# Patient Record
Sex: Female | Born: 1935 | Race: White | Hispanic: No
Health system: Southern US, Community
[De-identification: ages and names within clinical notes are randomized; demographics above are authoritative.]

---

## 2005-11-02 ENCOUNTER — Ambulatory Visit: Payer: Self-pay | Admitting: Internal Medicine

## 2008-04-21 ENCOUNTER — Ambulatory Visit: Payer: Self-pay | Admitting: Family Medicine

## 2010-03-14 ENCOUNTER — Ambulatory Visit: Payer: Self-pay | Admitting: Internal Medicine

## 2010-06-28 ENCOUNTER — Ambulatory Visit: Payer: Self-pay | Admitting: Internal Medicine

## 2010-07-27 ENCOUNTER — Ambulatory Visit: Payer: Self-pay | Admitting: Internal Medicine

## 2010-10-16 ENCOUNTER — Ambulatory Visit: Payer: Self-pay | Admitting: Unknown Physician Specialty

## 2010-10-18 LAB — PATHOLOGY REPORT

## 2011-02-14 ENCOUNTER — Ambulatory Visit: Payer: Self-pay | Admitting: Internal Medicine

## 2011-06-23 ENCOUNTER — Emergency Department: Payer: Self-pay | Admitting: *Deleted

## 2011-07-19 ENCOUNTER — Ambulatory Visit: Payer: Self-pay | Admitting: Urology

## 2012-09-20 ENCOUNTER — Inpatient Hospital Stay: Payer: Self-pay | Admitting: Internal Medicine

## 2012-09-20 LAB — COMPREHENSIVE METABOLIC PANEL
Alkaline Phosphatase: 96 U/L (ref 50–136)
Anion Gap: 10 (ref 7–16)
Bilirubin,Total: 1 mg/dL (ref 0.2–1.0)
Co2: 23 mmol/L (ref 21–32)
Creatinine: 0.94 mg/dL (ref 0.60–1.30)
EGFR (African American): >60
EGFR (Non-African Amer.): 56 — ABNORMAL LOW
SGPT (ALT): 16 U/L (ref 12–78)

## 2012-09-20 LAB — CBC
HCT: 47.2 % — ABNORMAL HIGH (ref 35.0–47.0)
HGB: 15.9 g/dL (ref 12.0–16.0)
MCH: 30.2 pg (ref 26.0–34.0)
MCV: 90 fL (ref 80–100)
Platelet: 280 10*3/uL (ref 150–440)
RBC: 5.27 10*6/uL — ABNORMAL HIGH (ref 3.80–5.20)
WBC: 16.9 10*3/uL — ABNORMAL HIGH (ref 3.6–11.0)

## 2012-09-20 LAB — URINALYSIS, COMPLETE
Glucose,UR: NEGATIVE mg/dL (ref 0–75)
Hyaline Cast: 1
Nitrite: NEGATIVE
Protein: NEGATIVE
RBC,UR: 1 /HPF (ref 0–5)
Squamous Epithelial: 2
WBC UR: <1 /HPF (ref 0–5)

## 2012-09-20 LAB — TSH: Thyroid Stimulating Horm: 2.74 u[IU]/mL

## 2012-09-20 LAB — PROTIME-INR
INR: 1.3
Prothrombin Time: 16.9 secs — ABNORMAL HIGH (ref 11.5–14.7)

## 2012-09-20 LAB — DIGOXIN LEVEL: Digoxin: 0.65 ng/mL

## 2012-09-21 LAB — COMPREHENSIVE METABOLIC PANEL
Albumin: 2.9 g/dL — ABNORMAL LOW (ref 3.4–5.0)
Anion Gap: 9 (ref 7–16)
BUN: 14 mg/dL (ref 7–18)
Bilirubin,Total: 0.7 mg/dL (ref 0.2–1.0)
Calcium, Total: 7.9 mg/dL — ABNORMAL LOW (ref 8.5–10.1)
Creatinine: 0.85 mg/dL (ref 0.60–1.30)
Glucose: 137 mg/dL — ABNORMAL HIGH (ref 65–99)
Potassium: 3.5 mmol/L (ref 3.5–5.1)
SGOT(AST): 15 U/L (ref 15–37)
SGPT (ALT): 12 U/L (ref 12–78)
Total Protein: 5.5 g/dL — ABNORMAL LOW (ref 6.4–8.2)

## 2012-09-21 LAB — CLOSTRIDIUM DIFFICILE BY PCR

## 2012-09-21 LAB — CBC WITH DIFFERENTIAL/PLATELET
Eosinophil #: 0.1 10*3/uL (ref 0.0–0.7)
Eosinophil %: 0.3 %
HCT: 43.1 % (ref 35.0–47.0)
Lymphocyte #: 2 10*3/uL (ref 1.0–3.6)
Monocyte %: 7.9 %
Neutrophil #: 12.1 10*3/uL — ABNORMAL HIGH (ref 1.4–6.5)
Neutrophil %: 78.4 %
Platelet: 238 10*3/uL (ref 150–440)
RDW: 15 % — ABNORMAL HIGH (ref 11.5–14.5)
WBC: 15.4 10*3/uL — ABNORMAL HIGH (ref 3.6–11.0)

## 2012-09-21 LAB — PROTIME-INR
INR: 1.4
Prothrombin Time: 17.2 secs — ABNORMAL HIGH (ref 11.5–14.7)

## 2012-09-22 LAB — CBC WITH DIFFERENTIAL/PLATELET
Eosinophil #: 0.2 10*3/uL (ref 0.0–0.7)
HCT: 38.3 % (ref 35.0–47.0)
Lymphocyte %: 23.7 %
MCH: 30 pg (ref 26.0–34.0)
MCHC: 33.4 g/dL (ref 32.0–36.0)
Neutrophil %: 61.8 %
Platelet: 213 10*3/uL (ref 150–440)
RDW: 15 % — ABNORMAL HIGH (ref 11.5–14.5)

## 2012-09-22 LAB — PROTIME-INR: INR: 1.7

## 2012-09-22 LAB — URINE CULTURE

## 2012-09-23 LAB — BASIC METABOLIC PANEL
Anion Gap: 7 (ref 7–16)
BUN: 10 mg/dL (ref 7–18)
Calcium, Total: 8 mg/dL — ABNORMAL LOW (ref 8.5–10.1)
Chloride: 111 mmol/L — ABNORMAL HIGH (ref 98–107)
Co2: 25 mmol/L (ref 21–32)
EGFR (Non-African Amer.): >60
Glucose: 94 mg/dL (ref 65–99)
Osmolality: 284 (ref 275–301)
Potassium: 3.6 mmol/L (ref 3.5–5.1)

## 2012-09-23 LAB — PROTIME-INR
INR: 1.7
Prothrombin Time: 20.7 secs — ABNORMAL HIGH (ref 11.5–14.7)

## 2012-09-23 LAB — STOOL CULTURE

## 2012-09-26 LAB — CULTURE, BLOOD (SINGLE)

## 2013-02-03 ENCOUNTER — Emergency Department: Payer: Self-pay | Admitting: Emergency Medicine

## 2013-02-03 LAB — COMPREHENSIVE METABOLIC PANEL
Albumin: 3.3 g/dL — ABNORMAL LOW (ref 3.4–5.0)
Alkaline Phosphatase: 116 U/L (ref 50–136)
Anion Gap: 8 (ref 7–16)
BUN: 10 mg/dL (ref 7–18)
Bilirubin,Total: 0.9 mg/dL (ref 0.2–1.0)
Calcium, Total: 8.3 mg/dL — ABNORMAL LOW (ref 8.5–10.1)
Chloride: 109 mmol/L — ABNORMAL HIGH (ref 98–107)
Co2: 24 mmol/L (ref 21–32)
Creatinine: 0.71 mg/dL (ref 0.60–1.30)
EGFR (African American): >60
EGFR (Non-African Amer.): >60
Osmolality: 280 (ref 275–301)
Potassium: 4 mmol/L (ref 3.5–5.1)
SGOT(AST): 17 U/L (ref 15–37)
SGPT (ALT): 15 U/L (ref 12–78)
Sodium: 141 mmol/L (ref 136–145)
Total Protein: 6.2 g/dL — ABNORMAL LOW (ref 6.4–8.2)

## 2013-02-03 LAB — CBC
HCT: 40.9 % (ref 35.0–47.0)
MCH: 29.3 pg (ref 26.0–34.0)
MCHC: 33.6 g/dL (ref 32.0–36.0)
Platelet: 310 10*3/uL (ref 150–440)
RBC: 4.69 10*6/uL (ref 3.80–5.20)
RDW: 15.9 % — ABNORMAL HIGH (ref 11.5–14.5)
WBC: 10.3 10*3/uL (ref 3.6–11.0)

## 2013-02-03 LAB — PROTIME-INR
INR: 3
Prothrombin Time: 30.1 secs — ABNORMAL HIGH (ref 11.5–14.7)

## 2013-02-03 LAB — URINALYSIS, COMPLETE
Bilirubin,UR: NEGATIVE
Blood: NEGATIVE
Glucose,UR: NEGATIVE mg/dL (ref 0–75)
Ketone: NEGATIVE
Nitrite: POSITIVE
Protein: NEGATIVE
RBC,UR: 7 /HPF (ref 0–5)
Specific Gravity: 1.013 (ref 1.003–1.030)
Squamous Epithelial: 5
WBC UR: 12 /HPF (ref 0–5)

## 2013-02-03 LAB — PRO B NATRIURETIC PEPTIDE: B-Type Natriuretic Peptide: 2256 pg/mL — ABNORMAL HIGH (ref 0–450)

## 2014-10-01 ENCOUNTER — Inpatient Hospital Stay: Payer: Self-pay | Admitting: Internal Medicine

## 2014-10-01 LAB — CK-MB: CK-MB: 1.2 ng/mL (ref 0.5–3.6)

## 2014-10-01 LAB — URINALYSIS, COMPLETE
BLOOD: NEGATIVE
Bilirubin,UR: NEGATIVE
GLUCOSE, UR: NEGATIVE mg/dL (ref 0–75)
Ketone: NEGATIVE
Nitrite: POSITIVE
Ph: 6 (ref 4.5–8.0)
Protein: 30
Specific Gravity: 1.013 (ref 1.003–1.030)
WBC UR: 10 /HPF (ref 0–5)

## 2014-10-01 LAB — CBC
HCT: 42.6 % (ref 35.0–47.0)
HGB: 13.7 g/dL (ref 12.0–16.0)
MCH: 27.6 pg (ref 26.0–34.0)
MCHC: 32.2 g/dL (ref 32.0–36.0)
MCV: 86 fL (ref 80–100)
PLATELETS: 321 10*3/uL (ref 150–440)
RBC: 4.97 10*6/uL (ref 3.80–5.20)
RDW: 17.2 % — ABNORMAL HIGH (ref 11.5–14.5)
WBC: 13.3 10*3/uL — ABNORMAL HIGH (ref 3.6–11.0)

## 2014-10-01 LAB — COMPREHENSIVE METABOLIC PANEL
Albumin: 3.4 g/dL (ref 3.4–5.0)
Alkaline Phosphatase: 123 U/L — ABNORMAL HIGH
Anion Gap: 5 — ABNORMAL LOW (ref 7–16)
BILIRUBIN TOTAL: 1 mg/dL (ref 0.2–1.0)
BUN: 11 mg/dL (ref 7–18)
CREATININE: 0.78 mg/dL (ref 0.60–1.30)
Calcium, Total: 8.7 mg/dL (ref 8.5–10.1)
Chloride: 105 mmol/L (ref 98–107)
Co2: 28 mmol/L (ref 21–32)
EGFR (Non-African Amer.): >60
Glucose: 119 mg/dL — ABNORMAL HIGH (ref 65–99)
OSMOLALITY: 276 (ref 275–301)
Potassium: 4.2 mmol/L (ref 3.5–5.1)
SGOT(AST): 12 U/L — ABNORMAL LOW (ref 15–37)
SGPT (ALT): 13 U/L — ABNORMAL LOW
Sodium: 138 mmol/L (ref 136–145)
TOTAL PROTEIN: 6.7 g/dL (ref 6.4–8.2)

## 2014-10-01 LAB — PROTIME-INR
INR: 2.4
Prothrombin Time: 25.3 secs — ABNORMAL HIGH (ref 11.5–14.7)

## 2014-10-01 LAB — TROPONIN I: Troponin-I: <0.02 ng/mL

## 2014-10-01 LAB — CK: CK, Total: 24 U/L — ABNORMAL LOW (ref 26–192)

## 2014-10-01 LAB — PRO B NATRIURETIC PEPTIDE: B-TYPE NATIURETIC PEPTID: 1678 pg/mL — AB (ref 0–450)

## 2014-10-02 LAB — BASIC METABOLIC PANEL
Anion Gap: 5 — ABNORMAL LOW (ref 7–16)
BUN: 12 mg/dL (ref 7–18)
CALCIUM: 8.1 mg/dL — AB (ref 8.5–10.1)
CO2: 31 mmol/L (ref 21–32)
CREATININE: 0.71 mg/dL (ref 0.60–1.30)
Chloride: 103 mmol/L (ref 98–107)
EGFR (Non-African Amer.): >60
GLUCOSE: 108 mg/dL — AB (ref 65–99)
Osmolality: 278 (ref 275–301)
POTASSIUM: 3.8 mmol/L (ref 3.5–5.1)
SODIUM: 139 mmol/L (ref 136–145)

## 2014-10-02 LAB — CBC WITH DIFFERENTIAL/PLATELET
Basophil #: 0.1 10*3/uL (ref 0.0–0.1)
Basophil %: 0.4 %
EOS ABS: 0 10*3/uL (ref 0.0–0.7)
Eosinophil %: 0.2 %
HCT: 42 % (ref 35.0–47.0)
HGB: 13.6 g/dL (ref 12.0–16.0)
LYMPHS ABS: 1.3 10*3/uL (ref 1.0–3.6)
LYMPHS PCT: 8.6 %
MCH: 27.6 pg (ref 26.0–34.0)
MCHC: 32.4 g/dL (ref 32.0–36.0)
MCV: 85 fL (ref 80–100)
Monocyte #: 1.6 x10 3/mm — ABNORMAL HIGH (ref 0.2–0.9)
Monocyte %: 10.1 %
NEUTROS PCT: 80.7 %
Neutrophil #: 12.6 10*3/uL — ABNORMAL HIGH (ref 1.4–6.5)
Platelet: 338 10*3/uL (ref 150–440)
RBC: 4.93 10*6/uL (ref 3.80–5.20)
RDW: 16.7 % — AB (ref 11.5–14.5)
WBC: 15.6 10*3/uL — ABNORMAL HIGH (ref 3.6–11.0)

## 2014-10-02 LAB — TROPONIN I: Troponin-I: <0.02 ng/mL

## 2014-10-02 LAB — LIPID PANEL
CHOLESTEROL: 145 mg/dL (ref 0–200)
HDL: 91 mg/dL — AB (ref 40–60)
LDL CHOLESTEROL, CALC: 41 mg/dL (ref 0–100)
Triglycerides: 67 mg/dL (ref 0–200)
VLDL Cholesterol, Calc: 13 mg/dL (ref 5–40)

## 2014-10-02 LAB — CK
CK, TOTAL: 22 U/L — AB (ref 26–192)
CK, Total: 15 U/L — ABNORMAL LOW (ref 26–192)

## 2014-10-02 LAB — CK-MB
CK-MB: 1.4 ng/mL (ref 0.5–3.6)
CK-MB: 1.2 ng/mL (ref 0.5–3.6)

## 2014-10-02 LAB — PROTIME-INR
INR: 2.6
Prothrombin Time: 27.2 secs — ABNORMAL HIGH (ref 11.5–14.7)

## 2014-10-02 LAB — TSH: THYROID STIMULATING HORM: 1.29 u[IU]/mL

## 2014-10-03 LAB — BASIC METABOLIC PANEL
Anion Gap: 5 — ABNORMAL LOW (ref 7–16)
BUN: 12 mg/dL (ref 7–18)
CHLORIDE: 100 mmol/L (ref 98–107)
CREATININE: 0.76 mg/dL (ref 0.60–1.30)
Calcium, Total: 8.3 mg/dL — ABNORMAL LOW (ref 8.5–10.1)
Co2: 33 mmol/L — ABNORMAL HIGH (ref 21–32)
EGFR (African American): >60
Glucose: 115 mg/dL — ABNORMAL HIGH (ref 65–99)
OSMOLALITY: 276 (ref 275–301)
Potassium: 4 mmol/L (ref 3.5–5.1)
SODIUM: 138 mmol/L (ref 136–145)

## 2014-10-03 LAB — CBC WITH DIFFERENTIAL/PLATELET
Basophil #: 0.1 10*3/uL (ref 0.0–0.1)
Basophil %: 0.6 %
Eosinophil #: 0.1 10*3/uL (ref 0.0–0.7)
Eosinophil %: 0.4 %
HCT: 40.9 % (ref 35.0–47.0)
HGB: 13.2 g/dL (ref 12.0–16.0)
Lymphocyte #: 1.3 10*3/uL (ref 1.0–3.6)
Lymphocyte %: 9.5 %
MCH: 27.8 pg (ref 26.0–34.0)
MCHC: 32.3 g/dL (ref 32.0–36.0)
MCV: 86 fL (ref 80–100)
MONOS PCT: 10.9 %
Monocyte #: 1.5 x10 3/mm — ABNORMAL HIGH (ref 0.2–0.9)
NEUTROS ABS: 10.5 10*3/uL — AB (ref 1.4–6.5)
Neutrophil %: 78.6 %
Platelet: 299 10*3/uL (ref 150–440)
RBC: 4.76 10*6/uL (ref 3.80–5.20)
RDW: 16.7 % — ABNORMAL HIGH (ref 11.5–14.5)
WBC: 13.4 10*3/uL — ABNORMAL HIGH (ref 3.6–11.0)

## 2014-10-03 LAB — PROTIME-INR
INR: 2.5
PROTHROMBIN TIME: 26.6 s — AB (ref 11.5–14.7)

## 2014-10-03 LAB — URINE CULTURE

## 2014-10-04 LAB — BASIC METABOLIC PANEL
Anion Gap: 7 (ref 7–16)
BUN: 14 mg/dL (ref 7–18)
CALCIUM: 8 mg/dL — AB (ref 8.5–10.1)
CHLORIDE: 103 mmol/L (ref 98–107)
Co2: 31 mmol/L (ref 21–32)
Creatinine: 0.76 mg/dL (ref 0.60–1.30)
EGFR (Non-African Amer.): >60
Glucose: 88 mg/dL (ref 65–99)
OSMOLALITY: 281 (ref 275–301)
POTASSIUM: 3.6 mmol/L (ref 3.5–5.1)
SODIUM: 141 mmol/L (ref 136–145)

## 2014-10-04 LAB — CBC WITH DIFFERENTIAL/PLATELET
BASOS ABS: 0.1 10*3/uL (ref 0.0–0.1)
BASOS PCT: 1 %
EOS ABS: 0.3 10*3/uL (ref 0.0–0.7)
EOS PCT: 2.5 %
HCT: 40.5 % (ref 35.0–47.0)
HGB: 13.1 g/dL (ref 12.0–16.0)
Lymphocyte #: 1.7 10*3/uL (ref 1.0–3.6)
Lymphocyte %: 17.1 %
MCH: 27.6 pg (ref 26.0–34.0)
MCHC: 32.2 g/dL (ref 32.0–36.0)
MCV: 86 fL (ref 80–100)
Monocyte #: 1.5 x10 3/mm — ABNORMAL HIGH (ref 0.2–0.9)
Monocyte %: 15 %
Neutrophil #: 6.6 10*3/uL — ABNORMAL HIGH (ref 1.4–6.5)
Neutrophil %: 64.4 %
PLATELETS: 278 10*3/uL (ref 150–440)
RBC: 4.72 10*6/uL (ref 3.80–5.20)
RDW: 16.6 % — ABNORMAL HIGH (ref 11.5–14.5)
WBC: 10.2 10*3/uL (ref 3.6–11.0)

## 2014-10-04 LAB — PROTIME-INR
INR: 2.8
Prothrombin Time: 28.6 secs — ABNORMAL HIGH (ref 11.5–14.7)

## 2014-10-05 LAB — BASIC METABOLIC PANEL
ANION GAP: 6 — AB (ref 7–16)
BUN: 16 mg/dL (ref 7–18)
CO2: 33 mmol/L — AB (ref 21–32)
Calcium, Total: 8 mg/dL — ABNORMAL LOW (ref 8.5–10.1)
Chloride: 100 mmol/L (ref 98–107)
Creatinine: 0.82 mg/dL (ref 0.60–1.30)
EGFR (Non-African Amer.): >60
Glucose: 99 mg/dL (ref 65–99)
OSMOLALITY: 279 (ref 275–301)
POTASSIUM: 3.6 mmol/L (ref 3.5–5.1)
SODIUM: 139 mmol/L (ref 136–145)

## 2014-10-05 LAB — PROTIME-INR
INR: 2.8
PROTHROMBIN TIME: 28.5 s — AB (ref 11.5–14.7)

## 2014-12-21 ENCOUNTER — Emergency Department: Payer: Self-pay | Admitting: Student

## 2015-02-04 ENCOUNTER — Ambulatory Visit
Admit: 2015-02-04 | Disposition: A | Discharge status: Home / Self Care | Payer: Self-pay | Attending: Internal Medicine | Admitting: Internal Medicine

## 2015-02-09 ENCOUNTER — Inpatient Hospital Stay
Admit: 2015-02-09 | Disposition: A | Discharge status: Home / Self Care | Payer: Self-pay | Attending: Internal Medicine | Admitting: Internal Medicine

## 2015-02-09 LAB — CK TOTAL AND CKMB (NOT AT ARMC)
CK, Total: 9 U/L — ABNORMAL LOW
CK-MB: 4.2 ng/mL

## 2015-02-09 LAB — CBC
HCT: 36.8 % (ref 35.0–47.0)
HGB: 11.9 g/dL — ABNORMAL LOW (ref 12.0–16.0)
MCH: 26.9 pg (ref 26.0–34.0)
MCHC: 32.4 g/dL (ref 32.0–36.0)
MCV: 83 fL (ref 80–100)
PLATELETS: 456 10*3/uL — AB (ref 150–440)
RBC: 4.42 10*6/uL (ref 3.80–5.20)
RDW: 16.7 % — ABNORMAL HIGH (ref 11.5–14.5)
WBC: 25.9 10*3/uL — AB (ref 3.6–11.0)

## 2015-02-09 LAB — URINALYSIS, COMPLETE
Blood: NEGATIVE
GLUCOSE, UR: NEGATIVE mg/dL (ref 0–75)
Nitrite: POSITIVE
PH: 5 (ref 4.5–8.0)
Protein: 100
RBC,UR: 6 /HPF (ref 0–5)
SPECIFIC GRAVITY: 1.031 (ref 1.003–1.030)
Squamous Epithelial: 3
WBC UR: 16 /HPF (ref 0–5)

## 2015-02-09 LAB — COMPREHENSIVE METABOLIC PANEL
ALT: 9 U/L — AB
AST: 15 U/L
Albumin: 2.8 g/dL — ABNORMAL LOW
Alkaline Phosphatase: 112 U/L
Anion Gap: 10 (ref 7–16)
BILIRUBIN TOTAL: 1.5 mg/dL — AB
BUN: 18 mg/dL
CHLORIDE: 100 mmol/L — AB
CO2: 27 mmol/L
CREATININE: 0.66 mg/dL
Calcium, Total: 8.6 mg/dL — ABNORMAL LOW
EGFR (African American): >60
GLUCOSE: 120 mg/dL — AB
Potassium: 3.8 mmol/L
Sodium: 137 mmol/L
Total Protein: 6 g/dL — ABNORMAL LOW

## 2015-02-09 LAB — TROPONIN I
TROPONIN-I: 0.03 ng/mL
Troponin-I: 0.05 ng/mL — ABNORMAL HIGH
Troponin-I: <0.03 ng/mL

## 2015-02-09 LAB — PROTIME-INR
INR: 2.7
Prothrombin Time: 28.7 secs — ABNORMAL HIGH

## 2015-02-09 LAB — CK-MB
CK-MB: 4.5 ng/mL
CK-MB: 2.6 ng/mL

## 2015-02-09 LAB — PRO B NATRIURETIC PEPTIDE: B-TYPE NATIURETIC PEPTID: 375 pg/mL — AB

## 2015-02-09 LAB — TSH: THYROID STIMULATING HORM: 0.674 u[IU]/mL

## 2015-02-10 LAB — BASIC METABOLIC PANEL
Anion Gap: 11 (ref 7–16)
BUN: 16 mg/dL
CALCIUM: 8.6 mg/dL — AB
CHLORIDE: 101 mmol/L
Co2: 29 mmol/L
Creatinine: 0.52 mg/dL
EGFR (African American): >60
Glucose: 149 mg/dL — ABNORMAL HIGH
Potassium: 3.7 mmol/L
Sodium: 141 mmol/L

## 2015-02-10 LAB — CBC WITH DIFFERENTIAL/PLATELET
BANDS NEUTROPHIL: 10 %
HCT: 36.9 % (ref 35.0–47.0)
HGB: 11.7 g/dL — ABNORMAL LOW (ref 12.0–16.0)
Lymphocytes: 10 %
MCH: 26.3 pg (ref 26.0–34.0)
MCHC: 31.8 g/dL — AB (ref 32.0–36.0)
MCV: 83 fL (ref 80–100)
METAMYELOCYTE: 1 %
MONOS PCT: 3 %
MYELOCYTE: 1 %
Platelet: 400 10*3/uL (ref 150–440)
RBC: 4.47 10*6/uL (ref 3.80–5.20)
RDW: 16.4 % — ABNORMAL HIGH (ref 11.5–14.5)
SEGMENTED NEUTROPHILS: 75 %
WBC: 17.3 10*3/uL — ABNORMAL HIGH (ref 3.6–11.0)

## 2015-02-10 LAB — PROTIME-INR
INR: 2.6
Prothrombin Time: 27.5 secs — ABNORMAL HIGH

## 2015-02-11 LAB — BASIC METABOLIC PANEL
Anion Gap: 8 (ref 7–16)
BUN: 27 mg/dL — AB
CO2: 29 mmol/L
Calcium, Total: 8.7 mg/dL — ABNORMAL LOW
Chloride: 101 mmol/L
Creatinine: 0.68 mg/dL
EGFR (African American): >60
Glucose: 147 mg/dL — ABNORMAL HIGH
Potassium: 4.4 mmol/L
Sodium: 138 mmol/L

## 2015-02-11 LAB — CBC WITH DIFFERENTIAL/PLATELET
Bands: 6 %
COMMENT - H1-COM1: NORMAL
HCT: 35.1 % (ref 35.0–47.0)
HGB: 11.7 g/dL — ABNORMAL LOW (ref 12.0–16.0)
Lymphocytes: 5 %
MCH: 27.3 pg (ref 26.0–34.0)
MCHC: 33.4 g/dL (ref 32.0–36.0)
MCV: 82 fL (ref 80–100)
METAMYELOCYTE: 2 %
Monocytes: 10 %
Platelet: 406 10*3/uL (ref 150–440)
RBC: 4.3 10*6/uL (ref 3.80–5.20)
RDW: 16.4 % — AB (ref 11.5–14.5)
Segmented Neutrophils: 77 %
WBC: 30.6 10*3/uL — ABNORMAL HIGH (ref 3.6–11.0)

## 2015-02-11 LAB — PROTIME-INR
INR: 3.1
PROTHROMBIN TIME: 31.7 s — AB

## 2015-02-12 LAB — CBC WITH DIFFERENTIAL/PLATELET
BASOS PCT: 0.1 %
Basophil #: 0 10*3/uL (ref 0.0–0.1)
EOS PCT: 0 %
Eosinophil #: 0 10*3/uL (ref 0.0–0.7)
HCT: 35 % (ref 35.0–47.0)
HGB: 11 g/dL — AB (ref 12.0–16.0)
LYMPHS PCT: 3.8 %
Lymphocyte #: 0.9 10*3/uL — ABNORMAL LOW (ref 1.0–3.6)
MCH: 26.1 pg (ref 26.0–34.0)
MCHC: 31.4 g/dL — ABNORMAL LOW (ref 32.0–36.0)
MCV: 83 fL (ref 80–100)
MONOS PCT: 6.1 %
Monocyte #: 1.4 x10 3/mm — ABNORMAL HIGH (ref 0.2–0.9)
NEUTROS ABS: 20.8 10*3/uL — AB (ref 1.4–6.5)
Neutrophil %: 90 %
Platelet: 411 10*3/uL (ref 150–440)
RBC: 4.22 10*6/uL (ref 3.80–5.20)
RDW: 16.1 % — ABNORMAL HIGH (ref 11.5–14.5)
WBC: 23.1 10*3/uL — AB (ref 3.6–11.0)

## 2015-02-12 LAB — PROTIME-INR
INR: 3.8
PROTHROMBIN TIME: 37.7 s — AB

## 2015-02-12 LAB — BASIC METABOLIC PANEL
Anion Gap: 8 (ref 7–16)
BUN: 27 mg/dL — ABNORMAL HIGH
CHLORIDE: 99 mmol/L — AB
CO2: 31 mmol/L
CREATININE: 0.7 mg/dL
Calcium, Total: 8.3 mg/dL — ABNORMAL LOW
EGFR (African American): >60
EGFR (Non-African Amer.): >60
Glucose: 147 mg/dL — ABNORMAL HIGH
POTASSIUM: 3.5 mmol/L
Sodium: 138 mmol/L

## 2015-02-13 LAB — CBC WITH DIFFERENTIAL/PLATELET
BANDS NEUTROPHIL: 4 %
HCT: 33.8 % — ABNORMAL LOW (ref 35.0–47.0)
HGB: 10.9 g/dL — ABNORMAL LOW (ref 12.0–16.0)
Lymphocytes: 6 %
MCH: 26.7 pg (ref 26.0–34.0)
MCHC: 32.3 g/dL (ref 32.0–36.0)
MCV: 83 fL (ref 80–100)
Metamyelocyte: 3 %
Monocytes: 7 %
Myelocyte: 1 %
Platelet: 389 10*3/uL (ref 150–440)
RBC: 4.09 10*6/uL (ref 3.80–5.20)
RDW: 16.7 % — AB (ref 11.5–14.5)
SEGMENTED NEUTROPHILS: 79 %
WBC: 21.5 10*3/uL — AB (ref 3.6–11.0)

## 2015-02-13 LAB — BASIC METABOLIC PANEL
Anion Gap: 5 — ABNORMAL LOW (ref 7–16)
BUN: 29 mg/dL — ABNORMAL HIGH
CALCIUM: 8 mg/dL — AB
CHLORIDE: 100 mmol/L — AB
Co2: 34 mmol/L — ABNORMAL HIGH
Creatinine: 0.69 mg/dL
Glucose: 127 mg/dL — ABNORMAL HIGH
POTASSIUM: 3.2 mmol/L — AB
Sodium: 139 mmol/L

## 2015-02-13 LAB — PROTIME-INR
INR: 3.8
Prothrombin Time: 37.7 secs — ABNORMAL HIGH

## 2015-02-14 LAB — BASIC METABOLIC PANEL
Anion Gap: 7 (ref 7–16)
BUN: 27 mg/dL — ABNORMAL HIGH
Calcium, Total: 8.2 mg/dL — ABNORMAL LOW
Chloride: 99 mmol/L — ABNORMAL LOW
Co2: 33 mmol/L — ABNORMAL HIGH
Creatinine: 0.69 mg/dL
Glucose: 109 mg/dL — ABNORMAL HIGH
Potassium: 3.7 mmol/L
Sodium: 139 mmol/L

## 2015-02-14 LAB — CULTURE, BLOOD (SINGLE)

## 2015-02-14 LAB — URINE CULTURE

## 2015-02-14 LAB — PROTIME-INR
INR: 3
Prothrombin Time: 31.2 secs — ABNORMAL HIGH

## 2015-02-14 LAB — CBC WITH DIFFERENTIAL/PLATELET
BASOS PCT: 0.2 %
Basophil #: 0 10*3/uL (ref 0.0–0.1)
EOS ABS: 0 10*3/uL (ref 0.0–0.7)
EOS PCT: 0.1 %
HCT: 34 % — AB (ref 35.0–47.0)
HGB: 10.7 g/dL — AB (ref 12.0–16.0)
Lymphocyte #: 1.4 10*3/uL (ref 1.0–3.6)
Lymphocyte %: 5.6 %
MCH: 25.9 pg — ABNORMAL LOW (ref 26.0–34.0)
MCHC: 31.4 g/dL — ABNORMAL LOW (ref 32.0–36.0)
MCV: 83 fL (ref 80–100)
Monocyte #: 1.6 x10 3/mm — ABNORMAL HIGH (ref 0.2–0.9)
Monocyte %: 6.4 %
Neutrophil #: 21.4 10*3/uL — ABNORMAL HIGH (ref 1.4–6.5)
Neutrophil %: 87.7 %
Platelet: 379 10*3/uL (ref 150–440)
RBC: 4.12 10*6/uL (ref 3.80–5.20)
RDW: 16.6 % — ABNORMAL HIGH (ref 11.5–14.5)
WBC: 24.4 10*3/uL — ABNORMAL HIGH (ref 3.6–11.0)

## 2015-02-15 LAB — WBC: WBC: 25.2 10*3/uL — ABNORMAL HIGH (ref 3.6–11.0)

## 2015-02-15 LAB — BASIC METABOLIC PANEL
ANION GAP: 6 — AB (ref 7–16)
BUN: 23 mg/dL — ABNORMAL HIGH
CREATININE: 0.63 mg/dL
Calcium, Total: 7.9 mg/dL — ABNORMAL LOW
Chloride: 100 mmol/L — ABNORMAL LOW
Co2: 32 mmol/L
EGFR (African American): >60
EGFR (Non-African Amer.): >60
GLUCOSE: 107 mg/dL — AB
Potassium: 3.8 mmol/L
Sodium: 138 mmol/L

## 2015-02-15 LAB — PROTIME-INR
INR: 2.2
Prothrombin Time: 24.5 secs — ABNORMAL HIGH

## 2015-02-15 LAB — LIPASE, BLOOD: Lipase: 51 U/L

## 2015-02-16 LAB — CBC WITH DIFFERENTIAL/PLATELET
Bands: 1 %
HCT: 33 % — AB (ref 35.0–47.0)
HGB: 10.6 g/dL — AB (ref 12.0–16.0)
Lymphocytes: 10 %
MCH: 26.4 pg (ref 26.0–34.0)
MCHC: 32.2 g/dL (ref 32.0–36.0)
MCV: 82 fL (ref 80–100)
MYELOCYTE: 1 %
Metamyelocyte: 1 %
Monocytes: 6 %
Platelet: 286 10*3/uL (ref 150–440)
RBC: 4.04 10*6/uL (ref 3.80–5.20)
RDW: 16.2 % — ABNORMAL HIGH (ref 11.5–14.5)
Segmented Neutrophils: 81 %
WBC: 19.7 10*3/uL — ABNORMAL HIGH (ref 3.6–11.0)

## 2015-02-16 LAB — PROTIME-INR
INR: 1.7
Prothrombin Time: 20 secs — ABNORMAL HIGH

## 2015-02-16 LAB — AMYLASE: Amylase: 59 U/L

## 2015-02-17 LAB — WBC: WBC: 16.1 10*3/uL — AB (ref 3.6–11.0)

## 2015-02-18 LAB — CBC WITH DIFFERENTIAL/PLATELET
Basophil #: 0 10*3/uL (ref 0.0–0.1)
Basophil %: 0.2 %
EOS ABS: 0.1 10*3/uL (ref 0.0–0.7)
Eosinophil %: 0.3 %
HCT: 32.8 % — AB (ref 35.0–47.0)
HGB: 10.5 g/dL — ABNORMAL LOW (ref 12.0–16.0)
LYMPHS ABS: 1.4 10*3/uL (ref 1.0–3.6)
Lymphocyte %: 7.7 %
MCH: 26.5 pg (ref 26.0–34.0)
MCHC: 32 g/dL (ref 32.0–36.0)
MCV: 83 fL (ref 80–100)
MONO ABS: 1.5 x10 3/mm — AB (ref 0.2–0.9)
MONOS PCT: 8.3 %
NEUTROS ABS: 15.4 10*3/uL — AB (ref 1.4–6.5)
Neutrophil %: 83.5 %
PLATELETS: 276 10*3/uL (ref 150–440)
RBC: 3.96 10*6/uL (ref 3.80–5.20)
RDW: 16.7 % — AB (ref 11.5–14.5)
WBC: 18.4 10*3/uL — ABNORMAL HIGH (ref 3.6–11.0)

## 2015-02-18 LAB — URINALYSIS, COMPLETE
BACTERIA: NONE SEEN
Bilirubin,UR: NEGATIVE
Blood: NEGATIVE
GLUCOSE, UR: NEGATIVE mg/dL (ref 0–75)
KETONE: NEGATIVE
Leukocyte Esterase: NEGATIVE
NITRITE: NEGATIVE
Ph: 7 (ref 4.5–8.0)
Protein: NEGATIVE
RBC, UR: NONE SEEN /HPF (ref 0–5)
SPECIFIC GRAVITY: 1.01 (ref 1.003–1.030)

## 2015-02-19 LAB — CBC WITH DIFFERENTIAL/PLATELET
Bands: 2 %
HCT: 36.4 % (ref 35.0–47.0)
HGB: 11.7 g/dL — AB (ref 12.0–16.0)
LYMPHS PCT: 16 %
MCH: 26.8 pg (ref 26.0–34.0)
MCHC: 32.2 g/dL (ref 32.0–36.0)
MCV: 83 fL (ref 80–100)
METAMYELOCYTE: 1 %
MONOS PCT: 13 %
MYELOCYTE: 1 %
Platelet: 289 10*3/uL (ref 150–440)
RBC: 4.37 10*6/uL (ref 3.80–5.20)
RDW: 17.4 % — ABNORMAL HIGH (ref 11.5–14.5)
SEGMENTED NEUTROPHILS: 67 %
WBC: 13.9 10*3/uL — ABNORMAL HIGH (ref 3.6–11.0)

## 2015-02-20 LAB — CULTURE, BLOOD (SINGLE)

## 2015-02-20 LAB — URINE CULTURE

## 2015-02-21 LAB — BASIC METABOLIC PANEL
Anion Gap: 6 — ABNORMAL LOW (ref 7–16)
BUN: 12 mg/dL
CO2: 27 mmol/L
CREATININE: 0.6 mg/dL
Calcium, Total: 8.1 mg/dL — ABNORMAL LOW
Chloride: 107 mmol/L
EGFR (African American): >60
EGFR (Non-African Amer.): >60
Glucose: 100 mg/dL — ABNORMAL HIGH
Potassium: 4.2 mmol/L
SODIUM: 140 mmol/L

## 2015-02-21 LAB — CBC WITH DIFFERENTIAL/PLATELET
BANDS NEUTROPHIL: 2 %
Eosinophil: 1 %
HCT: 33.1 % — ABNORMAL LOW (ref 35.0–47.0)
HGB: 10.8 g/dL — ABNORMAL LOW (ref 12.0–16.0)
Lymphocytes: 17 %
MCH: 27.1 pg (ref 26.0–34.0)
MCHC: 32.5 g/dL (ref 32.0–36.0)
MCV: 83 fL (ref 80–100)
Metamyelocyte: 1 %
Monocytes: 9 %
Platelet: 307 10*3/uL (ref 150–440)
RBC: 3.98 10*6/uL (ref 3.80–5.20)
RDW: 17.1 % — ABNORMAL HIGH (ref 11.5–14.5)
Segmented Neutrophils: 70 %
WBC: 14.6 10*3/uL — AB (ref 3.6–11.0)

## 2015-02-22 NOTE — H&P (Signed)
PATIENT NAME:  Cindy Simon, Cindy Simon MR#:  [id number] DATE OF BIRTH:  06-22-1926  DATE OF ADMISSION:  09/20/2012  REFERRING PHYSICIAN: Dr. Margarita Grizzle  PRIMARY CARE PHYSICIAN: Dr. Daniel Nones  CHIEF COMPLAINT: Nausea, vomiting, diarrhea.   HISTORY OF PRESENT ILLNESS: The patient is a very pleasant 81 year old Caucasian female with past medical history neurofibromatosis, breast cancer status post bilateral mastectomy, early dementia, hyperthyroidism, depression, atrial fibrillation on anticoagulation, macular degeneration, hypertension who presented to [facility] with one day history of nausea, vomiting, and diarrhea. Patient relates that recently she was treated with Cipro for a urinary tract infection. One day prior to admission she developed nausea, vomiting, and diarrhea. She had numerous episodes of diarrhea while at home. She denied any obvious melena or blood in the stool. She had a CT scan of the abdomen and pelvis performed upon arrival here which showed dilated loops of small bowel in the left abdomen and small bowel wall thickening consistent with enteritis. They were adjacent changes of diverticulitis as well. There was no bowel obstruction. No evidence for mesenteric ischemia or free air. There also was a midline abdominal hernia but there is no sign of strangulation. Patient also has stable bilateral adrenal masses, most likely adenomas. There was a small nodule at the right lung base but this was stable from prior CT of 2011. She was started on Cipro and Flagyl in the Emergency Department. She reported that she had started to feel better in the Emergency Department.   PAST MEDICAL HISTORY:  1. 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  2. Breast cancer status post bilateral mastectomy.  3. Hyperthyroidism treated with propylthiouracil.  4. Early dementia.  5. Depression.  6. Hypertension.  7. Atrial fibrillation on anticoagulation.  8. Macular degeneration.   9. History of hysterectomy.  10. Cholecystectomy.   ALLERGIES: No known drug allergies.   HOME MEDICATIONS:  1. Protonix 40 mg p.o. b.i.d.  2. Warfarin 5.5 mg p.o. daily.  3. Vitamin D3 400 international units p.o. daily.  4. Vitamin B12 500 mcg p.o. daily. 5. Toviaz 8 mg p.o. daily.  6. Propylthiouracil 50 mg p.o. b.i.d.  7. Hydrochlorothiazide 12.5 mg p.o. daily.  8. Fluoxetine 40 mg p.o. daily.  9. Donepezil 5 mg p.o. daily.  10. Diltiazem, dosage unknown, 2 capsules p.o. daily.  11. Digoxin 125 mcg p.o. daily.   SOCIAL HISTORY: Patient lives in [place]. She is widowed. She lives with her daughter. She has three living children. She used to work in [employer]. She denies tobacco, alcohol, or illicit drug use.   FAMILY HISTORY: Mother had history of neurofibromatosis. Father died and had history of diabetes mellitus. She is not aware of neurofibromatosis in any of her siblings.    REVIEW OF SYSTEMS: CONSTITUTIONAL: Patient reports subjective fevers. Denies weight loss. Endorses fatigue. EYES: Denies diplopia. Does have diminished vision out of the left eye. HENT: Reports intermittent headaches. Denies hearing loss. Denies epistaxis, sore throat. CARDIOVASCULAR: Denies chest pain. Has history of atrial fibrillation. RESPIRATORY: Denies cough, shortness of breath, hemoptysis. GASTROINTESTINAL: As per the history of present illness. Has had has nausea, vomiting, and diarrhea, also has some lower abdominal pain. MUSCULOSKELETAL: Denies joint pain, swelling, or redness. INTEGUMENTARY: Has numerous neurofibromas. NEUROLOGIC: Denies focal weakness or numbness but does have generalized weakness at present. PSYCHIATRIC: Has history of depression. ENDOCRINE: Denies polyuria, polydipsia, polyphagia. HEMATOLOGIC/LYMPHATIC: Denies easy bruisability, bleeding, or swollen lymph nodes. ALLERGY/IMMUNOLOGIC: Denies seasonal allergies or history of immunodeficiency.   PHYSICAL EXAMINATION:  VITAL SIGNS:  Temperature 97.4, pulse 75, respirations 20, blood pressure 165/78, pulse oximetry 94% on room air.   GENERAL: Well-developed, well-nourished Caucasian female appears her reported age, currently no acute distress.   HEENT: Normocephalic, atraumatic. Extraocular movements are intact. Pupils are equal, round, reactive to light. No scleral icterus. Conjunctivae are pink. No epistaxis noted. Gross hearing intact. Oral mucosa are dry.   NECK: Supple and without JVD, lymphadenopathy.   LUNGS: Clear to auscultation bilaterally with normal respiratory effort.   CARDIOVASCULAR: S1, S2. Patient noted to be irregular. 2/6 systolic ejection murmur heard.   ABDOMEN: Soft. There is bilateral lower quadrant tenderness noted. No significant rebound tenderness noted. No appreciable gross organomegaly.   EXTREMITIES: No clubbing, cyanosis, or edema.   NEUROLOGIC: Patient is alert and oriented to time, person, and place. Strength is 5/5 in both upper extremities. Strength is slightly diminished in the lower extremities as she had decreased strength with added resistance   GENITOURINARY: No suprapubic tenderness noted at this time.   MUSCULOSKELETAL: No joint redness, swelling or tenderness appreciated.   SKIN: Warm and dry. There are numerous neurofibromas within her skin.   PSYCHIATRIC: Patient with appropriate affect and appears to have good insight into her current illness.   LABORATORY, DIAGNOSTIC AND RADIOLOGICAL DATA: CT scan of the abdomen and pelvis demonstrated dilated loops of small bowel in the left abdomen with small bowel wall thickening consistent with enteritis. There was adjacent changes of diverticulitis. Small bowel wall thickening related to diverticulitis as well as enteritis. No bowel obstruction noted. No evidence of mesenteric ischemia. There was no free air. There were two 1.5 cm left aortic lymph nodes. There was a midline abdominal hernia with herniation of small bowel. Loops of  small bowel did not appear to be strangulated. There were stable bilateral adrenal masses. There is a small nodule at the right lung base which is stable in size from prior CT of 2011. Urinalysis was negative for protein, 1 RBC per high-power field and 1 WBC per high-power field. Chest x-ray showed cardiomegaly. Complete metabolic panel shows sodium 140, potassium 4.0, chloride 107, CO2 23, BUN 17, creatinine 0.94, glucose 138. CBC shows WBC 16.9, hemoglobin 15.9, hematocrit 47, platelets 280, INR 1.3. Digoxin level 0.65.   IMPRESSION/RECOMMENDATIONS: This is an 81 year old Caucasian female with past medical history neurofibromatosis, breast cancer status post bilateral mastectomy, dementia, hyperthyroidism, depression, hypertension, atrial fibrillation on anticoagulation, macular degeneration, urinary incontinence who presented to [facility] with nausea, vomiting, diarrhea and found to have enteritis as well as diverticulitis on CT scan. Patient also with significant leukocytosis.  1. Diverticulitis/enteritis. The patient was on antibiotic therapy at home for urinary tract infection. CT scan demonstrated enteritis as well as diverticulitis. However, C. difficile colitis is also a consideration. We will start the patient on IV fluid hydration. For now we will continue Cipro and Flagyl which the patient received while in the Emergency Department. Blood cultures have already been ordered. We will place the patient on a clear liquid diet. We will also obtain GI consultation.  2. Generalized weakness. Most likely due to the acute illness. Hopefully with treatment her lower extremity strength should improve.  3. Hypertension. We will hold hydrochlorothiazide as the patient has a potential dehydrating illness at this point in time. Will continue to monitor blood pressure closely.  4. Atrial fibrillation. Patient's INR was actually found to be a bit low. We will resume the patient on Coumadin  5.5 mg p.o. daily. Will also  continue the patient on Cardizem and digoxin.  5. Hyperthyroidism. We will continue the patient on propylthiouracil. We will also check a TSH at this point in time.  6. Dementia. Will continue the patient's home dose of Aricept.  7. Midline abdominal hernia with herniation of small bowel. There was no sign of strangulation on this. Not an acute issue at this point in time but would certainly recommend continued outpatient observation of this.  8. Deep vein thrombosis prophylaxis. Will start the patient on heparin 5000 units sub-Q q.12 hours.   9. CODE STATUS: FULL CODE at this time. The patient does have a daughter who lives with her who can help to make medical decisions for her.       TIME SPENT: One hour.    ____________________________ Lennox PippinsMunsoor N. Tylesha Gibeault, MD mnl:cms D: 09/20/2012 15:15:48 ET T: 09/20/2012 15:48:28 ET JOB#: [id number]  cc: Lennox PippinsMunsoor N. Javon Hupfer, MD, <Dictator> Lynnea FerrierBert J. Klein III, MD  Ria CommentMUNSOOR N Jeanette Moffatt MD ELECTRONICALLY SIGNED 10/18/2012 20:38

## 2015-02-22 NOTE — Consult Note (Signed)
PATIENT NAME:  Cindy Simon, Cindy Simon MR#:  [id number] DATE OF BIRTH:  [birth year]  DATE OF CONSULTATION:  09/21/2012  REFERRING PHYSICIAN:   CONSULTING PHYSICIAN:  Ezzard Standing. Bluford Kaufmann, MD  REASON FOR REFERRAL: History of diverticulitis.   DESCRIPTION: The patient is an 81 year old white female who has multiple medical history who presents with basically one-day history of nausea, vomiting, and diarrhea. She did not have any fevers or chills or gross hematochezia or melena. The patient was recently treated for a urinary tract infection with Cipro. On arrival to the emergency room, she had a CT scan that showed some dilated loops of bowel in the left abdomen as well as some small bowel thickening consistent with enteritis, gastroenteritis. There were also some adjacent changes of diverticulitis as well. As a result, the patient was admitted for further treatment and evaluation.   By the time I saw her this morning, she is already starting to feel better and she is hungry for solid food. She tells me she had a normal colonoscopy here one to two years ago but I did not see any records of this anyway in the hospital record. She did have an upper endoscopy some years ago.   PAST MEDICAL HISTORY:  1. History of breast cancer requiring bilateral mastectomy.  2. Hyperthyroidism treated with PTU.  3. Dementia.  4. Depression. 5. Hypertension. 6. Chronic atrial fibrillation. 7. Macular degeneration. 8. Neurofibromatosis.   PAST SURGICAL HISTORY:  1. Cholecystectomy. 2. Hysterectomy. 3. Breast cancer surgery.   ALLERGIES: She has no known drug allergies.   MEDICATIONS AT HOME: Protonix, Coumadin at 5.5 mg daily, vitamins, Toviaz,  PTU, hydrochlorothiazide, Donepezil, diltiazem, digoxin, and fluoxetine.  SOCIAL HISTORY: She denies alcohol or tobacco use.   FAMILY HISTORY: History is notable for her mother having neurofibromatosis as well as father with diabetes.  REVIEW OF SYSTEMS: There is really no fevers or  chills or weight gain or weight loss. There is some fatigue. There is no blurred vision or hearing changes. There is no chest pain or palpitations. There is no coughing or shortness of breath. GI symptoms have been described already including nausea, vomiting, and diarrhea, but no hematochezia. The rest of the review of symptoms is negative.   PHYSICAL EXAMINATION:   GENERAL: The patient is in no acute distress.   VITAL SIGNS: He is afebrile with temperature of 97.7 this morning, pulse 65, blood pressure 163/65, and pulse oximetry 95%.   HEENT: Normocephalic, atraumatic head. Pupils are equally reactive. Throat was clear.   NECK: Supple.   CARDIAC: Irregularly irregular rhythm with a systolic murmur.   PULMONARY: Lungs are clear bilaterally.   ABDOMEN: Normoactive bowel sounds. It was soft. There is some tenderness bilaterally in the lower abdomen. There is no hepatomegaly. She had active bowel sounds.   EXTREMITIES: No clubbing, cyanosis, or edema.   NEUROLOGICAL: Examination is nonfocal.   SKIN: Examination shows neurofibromas of the skin.   LABS/RADIOLOGIC STUDIES: Sodium is 140, potassium 3.5, chloride 107, CO2 24, BUN 14, creatinine 0.85, and calcium 7.9. Liver enzymes are normal. TSH is 2.75. Digoxin level was 0.65. White count was 15.4 and hemoglobin 14.3. INR was 1.4.   Urine culture was positive. Blood culture is negative so far. C. difficile was negative. Stool tests are negative so far.   Urinalysis is negative.   Again, CT scan showed some dilated loops of small bowel with some thickening consistent with gastroenteritis. There appears to be some diverticulitis adjacent to it. He also has an  abdominal hernia but this herniation of small bowel does not appear to be strangulated.   IMPRESSION AND RECOMMENDATIONS: This is a patient with gastroenteritis and possible diverticulitis. The patient is on IV antibiotics. The patient wants to eat solid foods so at least try and start  low residue diet and see how she does. If she does well, then she can be discharged on antibiotics. Even though she said she had a normal colonoscopy, I cannot find any records. If it is true that she did not have a colonoscopy in the recent past, then she will need a colonoscopy scheduled as an outpatient in approximately two months. Thank you for the referral.  ____________________________ Ezzard StandingPaul Y. Bluford Kaufmannh, MD pyo:slb D: 09/22/2012 13:17:00 ET T: 09/22/2012 13:27:49 ET JOB#: [id number]  cc: Ezzard StandingPaul Y. Bluford Kaufmannh, MD, <Dictator> Ezzard StandingPAUL Y Emilygrace Grothe MD ELECTRONICALLY SIGNED 09/22/2012 17:03

## 2015-02-22 NOTE — Consult Note (Signed)
Chief Complaint:   Subjective/Chief Complaint Feeling ok. No abd pain. Tolerated solids   VITAL SIGNS/ANCILLARY NOTES: **Vital Signs.:   18-Nov-13 13:58   Vital Signs Type Q 4hr   Temperature Temperature (F) 98.4   Celsius 36.8   Temperature Source Oral   Pulse Pulse 56   Respirations Respirations 17   Systolic BP Systolic BP 153   Diastolic BP (mmHg) Diastolic BP (mmHg) 70   Mean BP 97   Pulse Ox % Pulse Ox % 94   Pulse Ox Activity Level  At rest   Oxygen Delivery Room Air/ 21 %   Brief Assessment:   Cardiac Regular    Respiratory clear BS    Gastrointestinal Normal   Lab Results: Routine Coag:  18-Nov-13 03:24    Prothrombin  19.9   INR 1.7 (INR reference interval applies to patients on anticoagulant therapy. A single INR therapeutic range for coumarins is not optimal for all indications; however, the suggested range for most indications is 2.0 - 3.0. Exceptions to the INR Reference Range may include: Prosthetic heart valves, acute myocardial infarction, prevention of myocardial infarction, and combinations of aspirin and anticoagulant. The need for a higher or lower target INR must be assessed individually. Reference: The Pharmacology and Management of the Vitamin K  antagonists: the seventh ACCP Conference on Antithrombotic and Thrombolytic Therapy. Chest.2004 Sept:126 (3suppl): [id number]. A HCT value >55% may artifactually increase the PT.  In one study,  the increase was an average of 25%. Reference:  "Effect on Routine and Special Coagulation Testing Values of Citrate Anticoagulant Adjustment in Patients with High HCT Values." American Journal of Clinical Pathology 2006;126:400-405.)  Routine Hem:  18-Nov-13 03:24    WBC (CBC) 9.3   RBC (CBC) 4.26   Hemoglobin (CBC) 12.8   Hematocrit (CBC) 38.3   Platelet Count (CBC) 213   MCV 90   MCH 30.0   MCHC 33.4   RDW  15.0   Neutrophil % 61.8   Lymphocyte % 23.7   Monocyte % 12.0   Eosinophil % 1.6    Basophil % 0.9   Neutrophil # 5.7   Lymphocyte # 2.2   Monocyte #  1.1   Eosinophil # 0.2   Basophil # 0.1 (Result(s) reported on 22 Sep 2012 at 03:47AM.)   Assessment/Plan:  Assessment/Plan:   Assessment Enteritis/diverticulitis. Improving.    Plan Pt [facility] Hopefully discharge soon on Abx. [facility]. If patient still here on Wed, will check back on Wed. THanks.   Electronic Signatures: Lutricia Feilh, Lajeana Strough (MD)  (Signed [phone]-Nov-13 15:53)  Authored: Chief Complaint, VITAL SIGNS/ANCILLARY NOTES, Brief Assessment, Lab Results, Assessment/Plan   Last Updated: 18-Nov-13 15:53 by Lutricia Feilh, Marnell Mcdaniel (MD)

## 2015-02-22 NOTE — Discharge Summary (Signed)
PATIENT NAME:  Cindy Simon, Cindy Simon MR#:  [id number] DATE OF BIRTH:  1926/07/28  DATE OF ADMISSION:  09/20/2012 DATE OF DISCHARGE:  09/23/2012  FINAL DIAGNOSES:  1. Diverticulitis.  2. Neurofibromatosis.  3. Dementia.  4. Atrial fibrillation.  5. Hypertension.  6. Urinary tract infection.  7. Hyperthyroidism.  8. Depression with anxiety.   HISTORY AND PHYSICAL: Please see dictated admission history and physical.    HOSPITAL COURSE: Patient was admitted with nausea, vomiting, diarrhea, abdominal pain. Initial CT scan revealed question of enteritis and inflammation of the small bowel, however, there was also possibility that this was secondary to diverticulitis. Clinically patient appeared to be more consistent with diverticulitis, and treatment was initiated for this, to which she responded well. She was placed on Cipro and metronidazole intravenously, with resolution of her symptoms over the next 24 to 36 hours. She was evaluated by GI to help answer the questions raised by the CT scan and they were comfortable with this plan. The patient has reported a colonoscopy in 2008, however, we have not been able to get results on this; she reported to us that [facility]. Recommendations made for consideration of repeat colonoscopy, although in this 81 year old demented patient with atrial fibrillation, it is not entirely certain that this is the best course of action. This was discussed with family members and will readdress when she is completely well from the above.   She was converted to oral medications, diet was started, taken off IV fluids, and she tolerated this without major issues. Physical therapy ambulated the patient. She initially only when 25 feet. Question was raised of skilled nursing, however, fortunately with a second evaluation by physical therapy the next day she was going over 100 feet which appears close to baseline. Family members were present and they felt like she was  ambulating well enough to go home, and so she was discharged home in stable condition with her physical activity to be up with a walker as tolerated. She will follow a 2 gram sodium diet, with her diet bland for one week. She will follow up in our office within the next one week. Home health nursing and physical therapy was ordered for the patient as well.   DISCHARGE MEDICATIONS:  1. Aricept 5 mg p.o. at bedtime.  2. Pantoprazole 40 mg p.o. b.i.d.  3. Fluoxetine 40 mg p.o. daily.  4. Cardizem CD 180 mg p.o. daily.  5. Toviaz 8 mg p.o. daily.  6. Vitamin B12 500 mcg p.o. daily.  7. Vitamin D3 400 units p.o. daily.  8. Seroquel 25 mg p.o. at bedtime.  9. Cipro 500 mg p.o. b.i.d. x7 days.  10. Flagyl 500 mg p.o. t.i.d. x7 days.  11. Coumadin 6 mg p.o. at bedtime.  12. Losartan 50 mg p.o. daily.  13. Propylthiouracil 50 mg p.o. b.i.d.  14. She is given instructions to hold digoxin and hydrochlorothiazide.   ____________________________ Lynnea FerrierBert J. Leroy Trim III, MD bjk:cms D: 09/24/2012 12:27:28 ET T: 09/24/2012 17:01:27 ET JOB#: [id number]  cc: Lynnea FerrierBert J. Desma Wilkowski III, MD, <Dictator> Daniel NonesBERT Yuliet Needs MD ELECTRONICALLY SIGNED 10/08/2012 12:50

## 2015-02-22 NOTE — Consult Note (Signed)
Pt seen and examined. Full consult to follow. Admitted with nausea/vomiting/diarrhea. CT consistent with gastroenteritis and possible diverticulitis. Told me she had normal colonoscopy here 1-2 yrs ago. However, no record of colonoscopy done on hospital records. Already, feeling better. Wants to eat solids. Agree with Abx coverage. Will try low residue diet. If tolerates solids, then can be discharged soon on Abx. If patient did not have colonoscopy in the past, will need colonoscopy scheduled as outpt in 2 months. Thanks.  Electronic Signatures: Lutricia Feilh, Oran Dillenburg (MD)  (Signed on 17-Nov-13 11:10)  Authored  Last Updated: 17-Nov-13 11:10 by Lutricia Feilh, Willies Laviolette (MD)

## 2015-02-26 NOTE — Consult Note (Signed)
PATIENT NAME:  Cindy Simon, Cindy Simon MR#:  [id number] DATE OF BIRTH:  [birth year]  DATE OF CONSULTATION:  10/01/2014  REFERRING PHYSICIAN:   CONSULTING PHYSICIAN:  Cindy MillardAlexander Yosselyn Tax, MD  PRIMARY CARE PHYSICIAN:  Graciela HusbandsKlein, MD   CHIEF COMPLAINT: Stomach pain and weakness.   REASON FOR CONSULTATION: Consultation requested for evaluation of congestive heart failure.   HISTORY OF PRESENT ILLNESS: The patient is an 81 year old female who [facility] Room with mid abdominal discomfort, generalized weakness, and foul-smelling urine. According to the patient and daughter, the patient has had a long-standing history of urinary incontinence and recently has been experiencing foul smelling urine. She has also had a recent history of increase in generalized weakness and was unable to safely get out of bed today. The patient also complains of long-standing bilateral pedal edema and shortness of breath, particularly with exertion. The patient has chronic atrial fibrillation on warfarin therapy.   PAST MEDICAL HISTORY: 1.  Atrial fibrillation.  2.  Urinary incontinence.  3.  Neurofibromatosis.  4.  Hypertension.   MEDICATIONS: Warfarin 4 mg daily, losartan 50 mg daily, diltiazem 360 mg daily, vitamin D3 of 400 international units daily, vitamin B12 of 500 mcg daily, Seroquel 25 mg daily, propylthiouracil 50 mg 2 tabs b.i.d., pantoprazole 40 mg b.i.d., oxybutynin 5 mg daily, fluoxetine 40 mg daily, donepezil 5 mg daily.   SOCIAL HISTORY: The patient denies tobacco abuse, currently resides with her daughter.   FAMILY HISTORY: No immediate family history of coronary artery disease or myocardial infarction.   REVIEW OF SYSTEMS:  CONSTITUTIONAL: No fever or chills. The patient does have generalized weakness.  EYES: No blurry vision.  EARS: No hearing loss.  RESPIRATORY: The patient has exertional dyspnea which is chronic.  CARDIOVASCULAR: The patient has chronic atrial fibrillation.  GASTROINTESTINAL:  The patient does complain of mid abdominal discomfort.  GENITOURINARY: The patient has urinary incontinence with foul smelling urine.  SKIN: Patient has history of neurofibromatosis.  MUSCULOSKELETAL: The patient denies arthralgias or myalgias.  NEUROLOGICAL: The patient denies focal muscle weakness or numbness.  PSYCHOLOGICAL: No depression or anxiety.   PHYSICAL EXAMINATION: VITAL SIGNS: Blood pressure was 153/69, pulse 102, respirations 18, temperature 98.4, pulse oximetry 96%.  HEENT: Pupils equal, reactive to light and accommodation.  NECK: Supple without thyromegaly.  LUNGS: Clear.  HEART: Normal JVP, normal PMI, irregular, irregular rhythm, normal S1, S2, no appreciable gallop, murmur, or rub.  ABDOMEN: Soft and nontender. Pulses were intact bilaterally. There is 1+ bilateral pedal edema.  MUSCULOSKELETAL: Normal muscle tone.  NEUROLOGIC: The patient is alert and oriented x 3, motor and sensory both grossly intact.   IMPRESSION: An 81 year old female who presents with probable urinary tract infection with known history of urinary incontinence with generalized weakness and mid abdominal discomfort. The patient does have mild bilateral peripheral edema with chest x-ray revealing possible mild pulmonary edema. The patient has had a long and chronic history of exertional dyspnea.   RECOMMENDATIONS: 1.  Agree with overall current therapy.  2.  Review 2D echocardiogram.  3.  Further recommendations pending echocardiogram results.    ____________________________ Cindy MillardAlexander Tahmir Kleckner, MD ap:nt D: 10/01/2014 16:26:42 ET T: 10/01/2014 18:07:00 ET JOB#: [id number]  cc: Cindy MillardAlexander Viha Kriegel, MD, <Dictator> Cindy MillardALEXANDER Mihail Prettyman MD ELECTRONICALLY SIGNED 11/02/2014 13:07

## 2015-02-26 NOTE — Discharge Summary (Signed)
PATIENT NAME:  Cindy Simon, Cindy Simon MR#:  [id number] DATE OF BIRTH:  1925/11/10  DATE OF ADMISSION:  10/01/2014 DATE OF DISCHARGE:    FINAL DIAGNOSES: 1.  Escherichia coli urinary tract infection.  2.  Atrial fibrillation, chronic and persistent.  3.  Essential hypertension.  4.  Acute on chronic right-sided systolic congestive heart failure.  5.  Obstructive sleep apnea with poor compliance with BiPAP and Lasix.  6.  Chronic constipation.  7.  Dementia, Alzheimer's type with behavioral disturbance, well controlled.  8.  Hyperthyroidism.  9.  Hyperglycemia.  10.   History of breast cancer.  11.   Depression and anxiety.  12.   Macular degeneration with severe bilateral visual impairment.  13.   Status post hysterectomy.  14.   Status post cholecystectomy.   HISTORY AND PHYSICAL: Please see dictated history and physical.   [place]: The patient was admitted with the finding of urinary tract infection and placed on antibiotics. Converted over to oral antibiotics with no issues, no fevers, chills. She has chronic urinary incontinence and is prone to urinary tract infections unfortunately.   She was found to have increasing fluid retention and evidence of pulmonary edema, consistent with acute on chronic exacerbation of her chronic right-sided systolic congestive heart failure. Echocardiogram revealed preserved LV function. She has a history of sleep apnea, has been on BiPAP previously, but has been noncompliant with this, although CPAP was tried in the hospital without much success. She was maintained on oxygen. Oxygen saturation did improve to 90% on room air at rest but appeared to deteriorate with movement and she may need this continued. She was started on diuretics and responded well to this. She had been on Lasix at home, but had not been taking this for about a month or so.   Physical therapy worked with the patient. She has a history of spinal stenosis for which conservative  measures only have been indicated, with MRI many years ago confirming this. She has had trouble at baseline with ambulation, but appeared to be weaker than baseline, and so it was felt that she would benefit from going through skilled nursing and rehabilitation in hopes of being able to return to her prior level of functioning. Previously she had been living with her daughter who provided most of her ADLs. At this time, she will be transferred to a skilled nursing facility. She should be weighed daily and call the physician with a more than 2 pound gain in 1 day or 5 pounds in 1 week or any increasing signs or symptoms of heart failure. She should follow a no added salt diet. She will be on 2 liters nasal cannula. She should be up as tolerated with a walker with assistance. Physical therapy and occupational therapy should evaluate and treat. She will follow up with the nursing home physician. Recommendations for MET-B, CBC, urinalysis, and INR be done in 1 week with the results to the nursing home physician. It is recommended that she undergo a retrial of auto CPAP 5-20 whenever sleeping.   DISCHARGE MEDICATIONS: 1.  Donepezil 5 mg p.o. at bedtime.  2.  Pantoprazole 40 mg p.o. b.i.d.  3.  Fluoxetine 40 mg p.o. daily.  4.  Cardizem CD 360 mg p.o. daily.  5.  Propylthiouracil 100 mg p.o. b.i.d.  6.  Vitamin B12 at 500 mcg p.o. daily.  7.  Vitamin D 400 units p.o. daily.  8.  Losartan 50 mg p.o. daily.  9.  Coumadin  6 mg p.o. daily.  10.   Seroquel 25 mg p.o. at bedtime.  11.   Oxybutynin 5 mg p.o. daily.  12.   Keflex 500 mg p.o. t.i.d. x 7 days to complete course of antibiotics.  13.   Furosemide 40 mg p.o. daily.  14.   Colace 100 mg p.o. b.i.d.  15.   MiraLax 17 grams p.o. daily, to be held for loose stools.   CODE STATUS: The patient is full code.   TIME ON DISCHARGE: 40 minutes.    ____________________________ Adin Hector, MD bjk:at D: 10/05/2014 14:59:51 ET T: 10/05/2014  15:17:33 ET JOB#: [id number]  cc: Adin Hector, MD, <Dictator> Ramonita Lab MD ELECTRONICALLY SIGNED 10/12/2014 18:34

## 2015-02-26 NOTE — H&P (Signed)
PATIENT NAME:  Cindy Simon, Cindy Simon MR#:  [id number] DATE OF BIRTH:  [birth year]  DATE OF ADMISSION:  10/01/2014  PRIMARY CARE PHYSICIAN: Lynnea Ferrier, MD.   REFERRING ER PHYSICIAN: Jene Every, MD.   CHIEF COMPLAINT: Generalized weakness, swelling of her feet, foul-smelling urine.   HISTORY OF PRESENT ILLNESS: The patient is an 81 year old pleasant Caucasian female with history of neurofibromatosis, chronic history of bilateral lower extremity edema, was on Lasix which was discontinued recently by her primary care physician, is presenting to the ED with a chief complaint of generalized weakness, foul-smelling urine, and worsening of her lower extremity swelling. The patient has been having dark-colored urine associated with some nausea and abdominal pain in the suprapubic area. Had vomiting one time but denies any fever. The patient is brought into the ED. Urine was checked. Nitrites and leukocyte esterase are positive. Troponin is less than 0.02. Chest x-ray has revealed pulmonary vascular congestion. BNP is slightly elevated. She was given Lasix IV. The hospitalist team is called to admit the patient. Urine cultures were ordered and during my examination the patient is resting comfortably, feels weak and tired. Both son and daughter are at bedside. Denies any fever. She has not used any recent antibiotics.   PAST MEDICAL HISTORY: Neurofibromatosis, chronic history of atrial fibrillation on Coumadin. The patient has borderline diabetes mellitus, history of breast cancer, chronic history of depression, hypothyroidism treated with propylthiouracil, early dementia, depression, hypertension, macular degeneration, history of hysterectomy, cholecystectomy.   PAST SURGICAL HISTORY: Bilateral mastectomy, hysterectomy, cholecystectomy.   ALLERGIES: No known drug allergies.   PSYCHOSOCIAL HISTORY: Lives with her daughter. Denies any smoking, alcohol or illicit drug usage.   FAMILY HISTORY: Mother had history  of neurofibromatosis. Father died and had a history of diabetes mellitus.   HOME MEDICATIONS: Coumadin 5 mg 1.5 tablet p.o. once daily, vitamin D3 at 100 international units 1 capsule p.o. once daily, vitamin B12 at 500 mcg 1 tablet p.o. once daily, Seroquel 25 mg p.o. once daily, propylthiouracil 50 mg 2 tablets p.o. 2 times a day, pantoprazole 40 mg 1 tablet p.o. b.i.d., oxybutynin 5 mg p.o. once daily, losartan 50 mg 1 tablet p.o. once daily, Floxin 40 mg p.o. once daily, donepezil 5 mg 1 tablet p.o. once daily, diltiazem 180 mg 2 capsules, a total of 360 p.o. once daily.   REVIEW OF SYSTEMS: CONSTITUTIONAL: Denies fever. Complaining of fatigue.  EYES: Denies blurry vision, double vision.  EARS, NOSE, AND THROAT: Denies epistaxis, discharge.  RESPIRATORY: Denies cough, COPD.  CARDIOVASCULAR: No chest pain or palpitations.  GASTROINTESTINAL: Complaining of nausea, one episode of vomiting.  No diarrhea. Complaining of suprapubic abdominal pain.  GENITOURINARY: Dysuria, frequent urination, foul-smelling urine.  GYNECOLOGICAL AND BREASTS: Had breast cancer with bilateral mastectomy done. Denies any vaginal discharge. ENDOCRINE: Denies polyuria, nocturia, thyroid problems.  HEMATOLOGIC AND LYMPHATICS: No anemia, easy bruising or bleeding. INTEGUMENTARY: No acne, rash, lesions.  MUSCULOSKELETAL: No joint pain in the neck and back. Denies any gout.  NEUROLOGIC: Denies vertigo, ataxia.  PSYCHIATRIC: No ADD or OCD.   PHYSICAL EXAMINATION:  VITAL SIGNS: Temperature 97.7, pulse 82, respirations 20, blood pressure 158/54, pulse oximetry is 95%.  GENERAL APPEARANCE: Not in acute distress. Moderately built and nourished.  HEENT: Normocephalic, atraumatic. Pupils are equally reactive to light and accommodation. No scleral icterus. No conjunctival injection. No sinus tenderness. No postnasal drip. Moist mucous membranes.  NECK: Supple. No JVD. No thyromegaly. Range of motion is intact.  LUNGS: Moderate  rhonchi at present. No  accessory muscle use. No anterior chest wall tenderness on palpation.  CARDIOVASCULAR: Irregularly irregular. No murmurs.  GASTROINTESTINAL: Soft. Bowel sounds are positive in all 4 quadrants. Some suprapubic discomfort is present. No masses felt.  NEUROLOGIC: Awake, alert, and oriented x 3. Cranial nerves II through XII are grossly intact. Motor and sensory are intact. Reflexes are 2+.  EXTREMITIES: With 3+ pitting edema present. No cyanosis. No clubbing.  SKIN: Neurofibromatosis.  PSYCHIATRIC: Normal mood and affect.   LABORATORIES AND IMAGING STUDIES: BNP 1678, glucose 119. BUN, creatinine, sodium, and potassium are normal. Anion gap is at 5. GFR is greater than 60. Serum osmolality and calcium are normal. Troponin less than 0.02. WBC 13.3; hemoglobin, hematocrit, and platelets are normal. LFTs: Alkaline phosphatase is elevated at 123, AST and ALT are below normal. PT 1 .3, INR 2.4. Urinalysis: Yellow in color, cloudy in appearance. Nitrites are positive, leukocyte esterase trace, bacteria 2+.   PORTABLE CHEST X-RAY: Cardiomegaly with probable mild interstitial pulmonary edema, mild left lung base atelectasis, unchanged prominence of the superior mediastinum, possibly to thyroid enlargement.   A 12-lead EKG: Atrial fibrillation at 82 beats per minute, nonspecific ST-T wave changes.   ASSESSMENT AND PLAN: An 81 year old Caucasian female brought into the Emergency Department with generalized weakness, foul-smelling urine, associated with frequent urination and worsening of bilateral lower extremity edema.  1.  Acute cystitis. Will admit her to medical floor. Will obtain urine culture and sensitivity. Will put her on IV Rocephin.  2.  Acute pulmonary vascular congestion, probably new onset congestive heart failure. We will admit her to telemetry, cycle cardiac biomarkers. We will obtain echocardiogram to evaluate wall motion abnormalities and left ventricular ejection fraction.  Will provide her Lasix 20 mg IV q. 12 hours. The patient is reporting that she was on Lasix before for her lower extremity edema, which was discontinued by her physician 7 months ago. We will put a consult to cardiology, Dr. Darrold JunkerParaschos.   3.  Chronic atrial fibrillation, rate controlled on Coumadin. INR is therapeutic at 2.4. We will continue Coumadin.  4.  Chronic neurofibromatosis.  5.  Borderline diabetes mellitus, not on any medications.  6.  History of breast cancer status post bilateral mastectomy, currently in remission. Follow up with oncology as recommended for surveillance.  7.  We will provide her gastrointestinal prophylaxis. Deep vein thrombosis prophylaxis is not needed as the patient is on Coumadin.   Plan of care discussed in detail with the patient and her daughter at bedside. She is full code. Daughter is the medical power of attorney.   TOTAL TIME SPENT ON ADMISSION: 50 minutes.   The patient will be transferred to Dr. Daniel NonesBert Klein.    ____________________________ Ramonita LabAruna Darrill Vreeland, MD ag:at D: 10/01/2014 16:09:18 ET T: 10/01/2014 16:32:57 ET JOB#: [id number]  cc: Ramonita LabAruna Lounette Sloan, MD, <Dictator> Ramonita LabARUNA Haven Pylant MD ELECTRONICALLY SIGNED 10/21/2014 16:46

## 2015-02-28 LAB — SURGICAL PATHOLOGY
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
## 2015-03-06 NOTE — Discharge Summary (Signed)
PATIENT NAME:  Cindy Simon, Asiyah MR#:  [id number] DATE OF BIRTH:  1926/08/12  DATE OF ADMISSION:  02/09/2015 DATE OF DISCHARGE:  02/21/2015  FINAL DIAGNOSES:  1. Pneumonia, likely aspiration pneumonitis.  2. Acute respiratory failure secondary to #1.  3. Sepsis secondary to #1.  4. Sleep apnea.  5. Atrial fibrillation. 6. Chronic lower extremity edema.  7. Chronic right-sided systolic congestive heart failure.  8. Lumbar degenerative disk disease with bilateral radiculopathy and leg weakness.  9. Dementia, Alzheimer's type.  10.  Neurofibromatosis.  11.  Recent gastritis.   HISTORY OF PRESENT ILLNESS:   Please see the dictated admission history and physical.   SUMMARY OF HOSPITAL COURSE:  The patient was admitted with increasing shortness of breath, sepsis, as well as acute COPD exacerbation, acute respiratory failure, and found to have evidence of pneumonia.  Evaluation also revealed evidence of gastritis.  She underwent endoscopy which revealed moderate hiatal hernia.  It was felt that she likely was having recurrent aspiration secondary to this and then aspiration pneumonitis as the source of her pneumonia.  She was placed on broad-spectrum antibiotics but continued to have significant leukocytosis.  Medications were adjusted, and the patient was evaluated by pulmonary and ID. Steroids were weaned down and further medication adjustments were made, with improvement in her white count and with the patient being able to be weaned off of oxygen.   She developed left lower quadrant abdominal pain.  CT scan revealed evidence of left rectus sheath bleeding, and so she was taken off Coumadin.  She will be maintained off anticoagulation for now as she is at high risk for recurrent bleed and the risks and benefits of this were discussed at length with the patient's family members.   CT scan performed to further evaluate the lungs revealed evidence of gastritis and possible pancreatic inflammation.  It was  thought that this was all related to the stomach, and the patient underwent endoscopy which confirmed gastritis.  She was kept on proton pump inhibitors and was asymptomatic.   She has chronic right-sided systolic congestive heart failure and sleep apnea.  Previous echocardiogram showed normal LV function.  CPAP was encouraged at night, and she is compliant with this at times at home.  We encouraged her to try to continue it at home.   She was maintained on her home medications for dementia with behavioral disturbance, and this was not an issue during her hospitalization.  She has chronic bilateral lower extremity weakness, and physical therapy did work with her here.  The recommendation was for hospice to follow the patient at home given her multiple other issues.   She was found to have evidence of a urinary tract infection.  This was treated during her hospitalization with antibiotics targeted towards her pneumonia.  She was incidentally found to have an adrenal mass which has been present on previous films and appears to be a benign adenoma, although it may be a neurofibroma.   Care management followed the patient, and we discussed discharge plan.  We anticipate patient  going home with hospice and this is the family's wish as well.  Palliative care assisted in making this decision.  Her oxygen saturation on day of discharge is 91% on room air, so she will not need home oxygen.  She will be discharged to home in stable condition with her physical activity to be up with assistance as tolerated.  She should follow a 2-gram sodium diet, although if her weight begins to fall, certainly  it would be reasonable to switch her over to a regular diet.  Will recommend that she be weighed daily with her physician called for more than a 2-pound gain in 1 day or 5 pounds in 1 week, although weighing the patient can be challenging given her leg weakness.  Will anticipate return to our office in 2 weeks depending on her  progress and what  family members would like to do with hospice.   DISCHARGE MEDICATIONS:  1. Donepezil 5 mg p.o. daily.  2. Fluoxetine 40 mg p.o. daily.  3. Cardizem CD 180 mg 2 tablets p.o. in the morning.  4. Propylthiouracil 100 mg p.o. b.i.d. for hyperthyroidism.  5. Losartan 50 mg p.o. daily.  6. Seroquel 25 mg p.o. at bedtime.  7. Furosemide 40 mg p.o. daily.  8. Oxybutynin 5 mg p.o. daily.  9. Vitamin B12 1000 mcg p.o. daily.  10.  Vitamin D 2000 units p.o. daily.  11.  Pantoprazole 40 mg p.o. b.i.d.  12.  Symbicort 160/4.5 one puff b.i.d.  13.  Albuterol MDI 2 puffs 4 times a day.  14.  Colace 100 mg p.o. b.i.d. as needed for stool softener.  15.  Levaquin 500 mg p.o. daily x 6 days to complete the course.  16.  Clindamycin 300 mg p.o. t.i.d. x 6 days to complete the course.   The patient was taken off Coumadin, off loperamide.  She will hold calcium for now, although she may resume the calcium at 1 tablet daily once she is finished with her antibiotics.   CODE STATUS:  The patient was do not resuscitate during this hospitalization and an out of facility DNR order was sent with her.    ____________________________ Lynnea Ferrier, MD bjk:kc D: 02/21/2015 07:58:58 ET T: 02/21/2015 14:46:13 ET JOB#: [id number]  cc: Lynnea Ferrier, MD, <Dictator> Daniel Nones MD ELECTRONICALLY SIGNED 03/01/2015 8:01

## 2015-03-06 NOTE — Consult Note (Signed)
Chief Complaint:  Subjective/Chief Complaint Pt denies nausea, heartburn, abdominal pain or dysphagia.  She reports a little scratchy throat.   VITAL SIGNS/ANCILLARY NOTES: **Vital Signs.:   15-Apr-16 08:00  Vital Signs Type Q 8hr  Celsius 36.6  Temperature Source oral  Pulse Pulse 74  Respirations Respirations 17  Systolic BP Systolic BP 149  Diastolic BP (mmHg) Diastolic BP (mmHg) 77  Mean BP 101  Pulse Ox % Pulse Ox % 93  Pulse Ox Activity Level  At rest  Oxygen Delivery Room Air/ 21 %   Brief Assessment:  GEN well developed, no acute distress, A/Ox3   Cardiac Regular   Respiratory normal resp effort   Gastrointestinal Normal   Gastrointestinal details normal Soft  Nontender  Nondistended  Bowel sounds normal  No rebound tenderness  No gaurding  No rigidity   EXTR negative cyanosis/clubbing, negative edema   Additional Physical Exam Skin: pink, warm, dry   Lab Results: Routine Chem:  15-Apr-16 03:47   Result Comment cbc - SMEAR SCANNED  Result(s) reported on 18 Feb 2015 at 08:10AM.  Routine Hem:  15-Apr-16 03:47   WBC (CBC)  18.4  RBC (CBC) 3.96  Hemoglobin (CBC)  10.5  Hematocrit (CBC)  32.8  Platelet Count (CBC) 276  MCV 83  MCH 26.5  MCHC 32.0  RDW  16.7  Neutrophil % 83.5  Lymphocyte % 7.7  Monocyte % 8.3  Eosinophil % 0.3  Basophil % 0.2  Neutrophil #  15.4  Lymphocyte # 1.4  Monocyte #  1.5  Eosinophil # 0.1  Basophil # 0.0   Assessment/Plan:  Assessment/Plan:  Assessment Gastritis:  Pt on PPI & should continue.  Bx pending of polyp & gastritis. Incidental duodenal diverticulosis. Medium Hiatal hernia:  Asymptomatic I have discussed her care [facility] & our plan of care is below.   Plan 1) Continue daily PPI indefinitely 2) FU biopsies Please call if you have any questions or concerns   Electronic Signatures: Joselyn ArrowJones, Aria Jarrard L (NP)  (Signed 15-Apr-16 09:43)  Authored: Chief Complaint, VITAL SIGNS/ANCILLARY NOTES, Brief  Assessment, Lab Results, Assessment/Plan   Last Updated: 15-Apr-16 09:43 by Joselyn ArrowJones, Kitt Ledet L (NP)

## 2015-03-06 NOTE — Consult Note (Addendum)
PATIENT NAME:  Cindy Simon, Cindy Simon MR#:  [id number] DATE OF BIRTH:  Jan 03, 1926  DATE OF CONSULTATION:  02/15/2015  REFERRING PHYSICIAN:   CONSULTING PHYSICIAN:  Stann Mainland. Sampson Goon, MD  REQUESTING PHYSICIAN:  Dr. Graciela Husbands.   REASON FOR CONSULTATION: Leukocytosis and pneumonia.   HISTORY OF PRESENT ILLNESS: A very pleasant 81 year old female admitted April 4 with weakness, fever, shortness of breath, and cough. She does have a history of neurofibromatosis. AFib, diabetes, breast cancer. She has spinal stenosis as well. Since admission, the patient has been treated for possible pneumonia; however, she continues to have persistent leukocytosis. Urine cultures have been positive for Escherichia coli and Aerococcus urinae.  Blood cultures have been negative. White count has remained in the 20s to 30s. Clinically, she says she is feeling a little better. She is coughing, but it is not productive. She has a main complaint of left lower quadrant abdominal pain which she says is worse with coughing. She does not have good control of her bowels, but denies having any current diarrhea. She has no dysuria at this point.   PAST MEDICAL HISTORY:  1.  Neurofibromatosis.  2.  AFib on Coumadin.  3.  Borderline diabetes.  4.  Breast cancer.  5.  Depression.  6.  Hyperthyroidism.  7.  Depression.  8.  Hypertension.  9.  Macular degeneration.  10.  Hysterectomy.   PAST SURGICAL HISTORY: Hysterectomy, cholecystectomy, bilateral mastectomy.   FAMILY HISTORY: Positive for neurofibromatosis.   SOCIAL HISTORY: The patient denies tobacco, alcohol, or drugs.   ALLERGIES: No known drug allergies.   ANTIBIOTICS SINCE ADMISSION INCLUDE:  Initially from admission on April 4 azithromycin x 2 doses, ceftriaxone x 2 days. It was changed to levofloxacin April 7 through current. Cefuroxime was added April 10. She received 1 dose of vancomycin. She is also on methylprednisolone decreasing from 60 q. 6 to 60 q. 12 on April 7 and  changing to oral prednisone April 7. Currently on 20 mg once a day.   REVIEW OF SYSTEMS:  Eleven systems reviewed and negative except as per history of present illness.   PHYSICAL EXAMINATION:  VITAL SIGNS: Temperature 97.5. She has been afebrile since admission. Pulse 87, blood pressure 136/71, respirations 20, saturation 90% on room air, 93% on noninvasive.  GENERAL: She is frail, elderly, covered with neurofibromas, no acute distress.  HEENT: Pupils reactive. Sclerae anicteric. Oropharynx clear, mucous membranes are dry.  NECK: Supple.  HEART: Regular.  LUNGS:  She has some decreased breath sounds bilateral bases and mild rhonchi.  ABDOMEN: Obese, soft, mildly tender to palpation in the left lower quadrant.  EXTREMITIES: She has 1+ edema bilateral lower extremities.  SKIN: She has multiple neurofibromas, none of which are inflamed.  NEUROLOGIC: She is alert and oriented x 3. Grossly nonfocal neuro exam.   DIAGNOSTIC DATA: Urine cultures April 6 grew Escherichia coli as well as Aerococcus urinae.  The Escherichia coli was greater than 100,000. The Aerococcus was 50,000. Blood cultures April 6 x 2 were negative. White count on admission was 25.9. It decreased to 17.3 initially, but peaked at 30.6 on April 8. Currently it is 25.2.  Most recent hemoglobin 10.7, platelets 379,000, INR is at goal at 2.2. Urinalysis on admission had 16 white cells. Renal function shows a creatinine of 0.63, BUN 23.  LFTs on admission with slightly elevated total bilirubin at 1.5, but an albumin low at 2.8. CK was normal at 9 now. Troponin I was borderline positive at 0.05.   IMAGING:  Chest x-ray April 6 showed posterior segment right lip upper lobe airspace consolidation consistent with pneumonia, small area of infiltrate left mid lung, areas of underlying interstitial fibrosis in the lower lung regions. Followup chest x-ray April 8 showed improved aeration of the right upper lobe with persistent mild airspace disease  consistent with pneumonia.   IMPRESSION: An 81 year old female with history of neurofibromatosis admitted with weakness and generalized fatigue as well as leukocytosis. She appeared to have pneumonia on chest x-ray and did have symptoms of a cough. However, it is nonproductive and no sputum has been able to be obtained. Urine culture was positive on admission with 2 organisms Escherichia coli and Aerococcus; however, her urinalysis was relatively bland with only 16 white cells. She has been treated initially broad-spectrum for her pneumonia with ceftriaxone and azithromycin and then changed to levofloxacin. She has been on steroids, but that would not necessarily explain her persistent leukocytosis. Her only other complaint is some left lower quadrant abdominal pain.   RECOMMENDATIONS:  1.  Given the left lower quadrant abdominal pain, I would suggest CT imaging of her abdomen and pelvis with contrast to look for diverticulitis. She did have an episode of abdominal pain it seems in December.  2.  I would also CT her chest to evaluate the focal infiltrate. This can be done without contrast.  3.  Continue levofloxacin and Ceftin at this point, although we may be able to simplify her regimen based on her urinary tract infection if she has no other source. 4.  Thank you for the consultation. I will be glad to follow with you.   ____________________________ Stann Mainlandavid P. Sampson GoonFitzgerald, MD dpf:sp D: 02/15/2015 09:51:13 ET T: 02/15/2015 10:38:58 ET JOB#: [id number]  cc: Stann Mainlandavid P. Sampson GoonFitzgerald, MD, <Dictator> Itzae Mccurdy Sampson GoonFITZGERALD MD ELECTRONICALLY SIGNED 03/08/2015 10:19

## 2015-03-06 NOTE — Consult Note (Signed)
Chief Complaint:  Subjective/Chief Complaint The patient has no Gi complaints. Denies any nausea fomiting or abd pain. EGD with polyp and gastritis. Pathology pending.   VITAL SIGNS/ANCILLARY NOTES: **Vital Signs.:   16-Apr-16 01:15  Respirations Respirations 18    07:48  Vital Signs Type Q 8hr  Temperature Temperature (F) 97.5  Celsius 36.3  Temperature Source oral  Pulse Pulse 75  Respirations Respirations 17  Systolic BP Systolic BP 152  Diastolic BP (mmHg) Diastolic BP (mmHg) 76  Mean BP 101  Pulse Ox % Pulse Ox % 96  Pulse Ox Activity Level  At rest  Oxygen Delivery Non-invasive ventilation (CPAP/BIPAP)  *Intake and Output.:   Shift 16-Apr-16 15:00  Grand Totals Intake:  240 Output:      Net:  240 24 Hr.:  240  Oral Intake      In:  240  Length of Stay Totals Intake:  8100 Output:      Net:  8100   Brief Assessment:  GEN well developed, well nourished, no acute distress   Respiratory normal resp effort   Gastrointestinal Normal   Gastrointestinal details normal Soft  Nontender   Additional Physical Exam Alert and orientated times 3   Lab Results: Routine Hem:  16-Apr-16 04:32   WBC (CBC)  13.9  RBC (CBC) 4.37  Hemoglobin (CBC)  11.7  Hematocrit (CBC) 36.4  Platelet Count (CBC) 289 (Result(s) reported on 19 Feb 2015 [facility]:22[facility].)  MCV 83  MCH 26.8  MCHC 32.2  RDW  17.4  Bands 2  Segmented Neutrophils 67  Lymphocytes 16  Monocytes 13  Metamyelocyte 1  Myelocyte 1  Diff Comment 1 ANISOCYTOSIS  Diff Comment 2 POIKILOCYTOSIS  Diff Comment 3 PLTS VARIED IN SIZE  Result(s) reported on 19 Feb 2015 at 06:22AM.   Assessment/Plan:  Assessment/Plan:  Assessment Abnormal CT scan with EGD findings of gastitis and a gastric polyp that was biopsied. No symptoms of pancreatitis.   Plan Await biopsy results. Nothing more to do as an inpatient. May follow up as an outpatient. Will sign off now.   Electronic Signatures: Midge MiniumWohl, Jcion Buddenhagen (MD)  (Signed  16-Apr-16 10:57)  Authored: Chief Complaint, VITAL SIGNS/ANCILLARY NOTES, Brief Assessment, Lab Results, Assessment/Plan   Last Updated: 16-Apr-16 10:57 by Midge MiniumWohl, Cherith Tewell (MD)

## 2015-03-06 NOTE — H&P (Signed)
PATIENT NAME:  Cindy Simon, MCELVEEN MR#:  [id number] DATE OF BIRTH:  Aug 15, 1926  DATE OF ADMISSION:  02/09/2015  PRIMARY CARE PHYSICIAN: Lynnea Ferrier, M.D.   HISTORY OF PRESENT ILLNESS: The patient is an 81 year old, Caucasian female, with past medical history significant for history of multiple medical problems including neurofibromatosis, history of chronic atrial fibrillation on Coumadin therapy, history of borderline diabetes mellitus, breast carcinoma, depression, hyperthyroidism, as well as early dementia, hypertension, and other medical problems, who presents to the hospital with complaints of not feeling well for the past 2 weeks. Apparently, the patient has been having progressively worsening shortness of breath, gasping for breath over the past 2 days. She has been coughing, but nothing new much of phlegm, rattling in the chest. She has been also feeling hot and chilly, intermittently, but denies any significant high fevers. She has been also wheezing. Her lungs sound wheezy. She presented to the Emergency Room via EMS, and on EMS report, the patient's O2 saturations were in the 70s. The patient's vital signs, however, remained stable. She was slightly tachycardic with heart rate in the 100s on arrival to the Emergency Room. The hospitalist services were contacted for admission. She is not on oxygen at home.   PAST MEDICAL HISTORY: Significant for history of neurofibromatosis, history of chronic atrial fibrillation on Coumadin therapy, history of borderline diabetes mellitus, breast cancer, positive history of depression, hyperthyroidism treated with propylthiouracil, dementia, depression, hypertension, macular degeneration, history of hysterectomy, cholecystectomy.   PAST SURGICAL HISTORY: Also significant for bilateral mastectomy, hysterectomy as well as cholecystectomy.   ALLERGIES: No known drug allergies.   FAMILY HISTORY: The patient's mother has history of neurofibromatosis. Father died,  had a history of diabetes mellitus.   MEDICATION LIST IS AS FOLLOWS: Diltiazem extended release 180 mg daily, donepezil 5 mg p.o. at bedtime, fluoxetine 40 mg p.o. daily, furosemide 40 mg p.o. daily, loperamide 2 mg every 4 hours as needed, losartan 50 mg p.o. daily, Os-Cal with vitamin D 500/200 two tablets once daily, oxybutynin extended-release 5 mg once daily, pantoprazole 40 mg p.o. daily, propylthiouracil 50 mg 2 tablets twice daily, Seroquel 25 mg p.o. at bedtime, vitamin B12 at 1000 mcg p.o. daily, vitamin D3 at 2000 units once daily, warfarin 5 mg p.o. once daily.   REVIEW OF SYSTEMS:  GENERAL: Difficult to obtain. This patient has difficulty hearing. Admits to feeling hot intermittently over the past 2 weeks. Last Saturday, she fell down. She was very weak, was not able to get up, bruised her left forehead, admits of right chest pains after she was lifted from the lying position after the fall. Since last Saturday, left arm pain. Has been constipated. Denies any bowel movements over the past 2 to 3 days. Denies any pains anywhere else. No weight loss or gain.  EYES: Denies any blurry vision, glaucoma, cataracts. RESPIRATORY: Admits to cough, wheezes, no hemoptysis. Admits to shortness of breath. Admits of painful respirations, especially on the right side.  CARDIOVASCULAR: Denies any central chest pain. Admits of right-sided chest pain after fall and the patient's family lifting her up from the lying position. Denies any significant edema, although admits of lower extremity edema, in general. Denies any arrhythmias, palpitations, or syncope.  GASTROINTESTINAL: Denies any nausea, vomiting, diarrhea, abdominal pain, rectal bleeding, or change in bowel habits.  GENITOURINARY: Denies dysuria, hematuria, frequency, incontinence.  ENDOCRINE: Denies polydipsia, nocturia, thyroid problems, heat or cold intolerance or thirst . SKIN: no rashes, leasions..  MUSCULOSKELETAL: Denies arthritis, cramps, or  swelling.  NEUROLOGIC: Denies numbness, epilepsy, tremors. PSYCHIATRIC: denies depression.   PHYSICAL EXAMINATION:  VITAL SIGNS: On arrival to the hospital, the patient's vital signs, temperature was 97.8, pulse was 90, respirations were 28, blood pressure 138/75, saturation was 92% on oxygen therapy.  GENERAL: This is a well-developed, well-nourished, obese Caucasian female sitting on the stretcher. She is coughing and is very uncomfortable. She is gasping for air intermittently, using nasal cannula for oxygen delivery.  HEENT: Her pupils are equal and reactive to light. Extraocular movements are intact. No icterus or conjunctivitis or difficulty hearing. Mucosa is moist.  NECK: No masses. Supple, nontender, thyroid is not enlarged. No adenopathy. No JVD or carotid bruits bilaterally. Full range of motion.  LUNGS: Diminished breath sounds bilaterally, anteriorly as well as posteriorly, rales as well as rhonchi and wheezes were heard, especially on the right side. She has labored inspirations, as well as gasping for air intermittently, increased effort to breathe. She is in moderate respiratory distress.  CARDIOVASCULAR: S1, S2 appreciated. Rhythm was irregularly irregular. PMI is not lateralized. Chest is tender to palpation in the right side, 1+ pedal pulses.  EXTREMITIES: Trace lower extremity edema, more on the left side, but no calf tenderness or cyanosis was noted. Skin was shrunken and scaly, especially in the right pretibial area. She also had some brownish discoloration of the skin in the right lower extremity signifying venous insufficiency.  ABDOMEN: Soft, nontender. Bowel sounds positive. No hepatosplenomegaly or masses were noted.  RECTAL: Deferred.  MUSCLE STRENGTH: Able to move all extremities. No cyanosis. The patient does have kyphosis. She has significant weakness, cannot even sit up in the bed by herself.  SKIN: Did not reveal any other rashes, except as mentioned above, although  the patient does have a bruise on the left forehead. She has also some healing skin scrapes on both knees, which according to the patient's family are since she spilled hot coffee on herself in November 2015. No otherwise erythema or nodularity. The patient's skin is indurated, especially in the lower extremities and warm to touch to palpation.  LYMPHATIC: No adenopathy in the cervical region.  NEUROLOGICAL: Cranial nerves are grossly intact.  Sensory is intact. No dysarthria or aphasia. The patient is alert, oriented to person and place, cooperative. Memory is somewhat impaired. No significant confusion, agitation, or depression was noted.   LABORATORY DATA: BMP 375, glucose 120. Otherwise, BMP was unremarkable. Calcium level is low at 8.6, albumin level of 2.8, total bilirubin of 1.5, total protein 6.0, otherwise liver enzymes were unremarkable.   Cardiac enzymes x 1 within normal limits.   White blood cell count is elevated to 25.9, hemoglobin 11.9, platelet count was 456,000.   Urinalysis: Amber, hazy urine, negative for glucose, 1+ bilirubin and 1+ ketones, specific gravity 1.031, pH was 5.0. Negative for blood, 100 mg/dL protein, positive for nitrites, trace leukocyte esterase, 6 red blood cells, 16 white blood cells, 3+ bacteria, 3 epithelial cells. Mucus was present.   RADIOLOGIC STUDIES: Chest x-ray, PA and lateral, 02/09/2015: Revealed posterior segment of right upper lobe airspace consolidation consistent with pneumonia, small area of infiltrate in the left midlung, areas of underlying interstitial fibrosis in the lower lung regions, stable cardiomegaly, and hiatal hernia was also noted.   ASSESSMENT AND PLAN: 1. Acute respiratory failure. Admit the patient to the medical floor. Continue her on oxygen therapy. Follow her oxygenation and wean her to room air as tolerated.  2. Chronic obstructive pulmonary disease exacerbation, start solumedrol, symbicort, tiotropium  and follow along  clinically.  3. Right upper lobe, left midlung pneumonia of unknown etiology, at present. Continue the patient on Rocephin and Zithromax and get sputum cultures, blood cultures.  4. Sepsis due to above, as well as possibly urinary tract infection. We will get blood, sputum, as well as urine cultures, and we will continue antibiotic therapy, follow culture results.  5. Leukocytosis. We will follow with antibiotic therapy.   TIME SPENT: 1 hour on this patient.   ____________________________ Katharina Caperima Nandita Mathenia, MD rv:JT D: 02/09/2015 10:24:45 ET T: 02/09/2015 12:02:05 ET JOB#: [id number]  cc: Katharina Caperima Jacia Sickman, MD, <Dictator> Lynnea FerrierBert J. Klein III, MD Othel Dicostanzo MD ELECTRONICALLY SIGNED 02/13/2015 16:55

## 2015-11-06 DEATH — deceased

## 2015-12-04 IMAGING — CT CT CHEST-ABD-PELV W/ CM
1 of 3 series · 12 of 32 positions shown, 17 images · IV contrast (omnipaque)
Comparison: CT 07/27/2010

ADDENDUM:
Correction: Reproductive findings: Patient status post hysterectomy.
Mild cystic change left ovary.

Findings conveyed Natacha Hevia on 02/15/2015  at[DATE].
CLINICAL DATA: Left lower quadrant pain.  Leukocytosis.
EXAM:
CT CHEST, ABDOMEN, AND PELVIS WITH CONTRAST
TECHNIQUE: Multidetector CT imaging of the chest, abdomen and pelvis was
performed following the standard protocol during bolus
administration of intravenous contrast.
CONTRAST:  100 mL Omnipaque

[Series 2: cap with · axial · 0.87mm/px · z∈[-948,-398]mm · 12 of 124 slices shown, 17 images]
[im 7/124  soft-tissue]
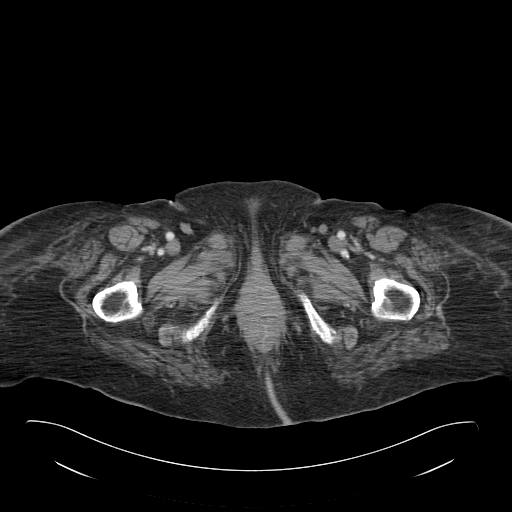
[im 7/124  bone]
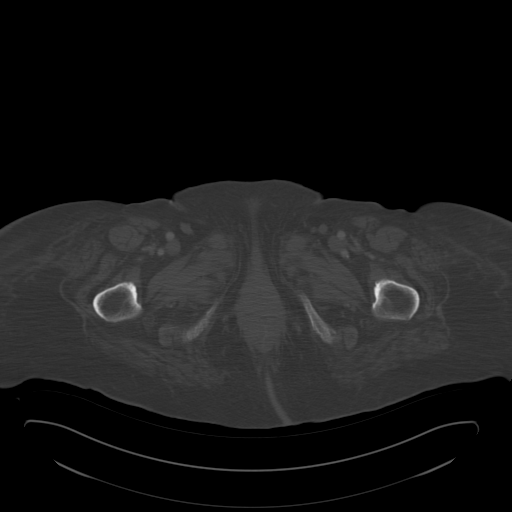
[im 21/124  soft-tissue]
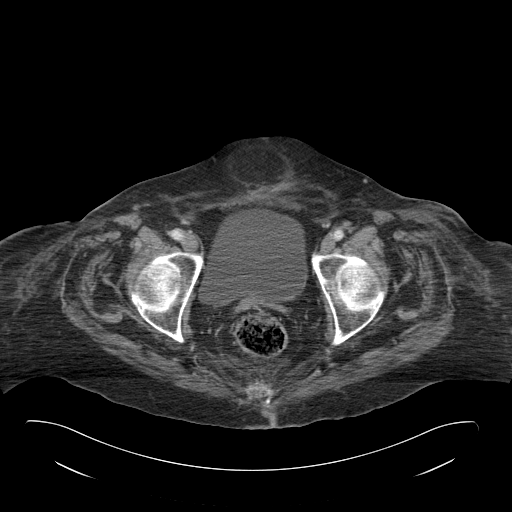
[im 28/124  soft-tissue]
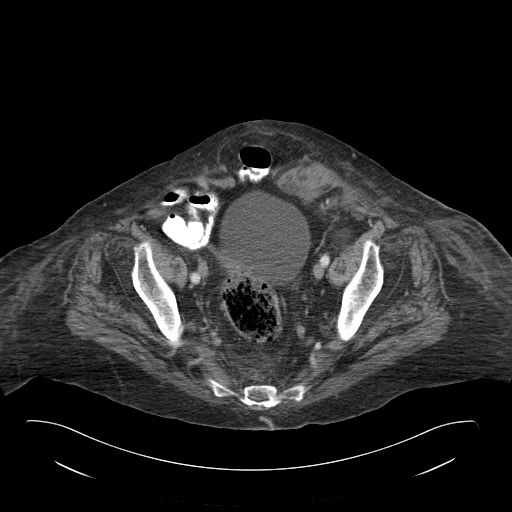
[im 42/124  soft-tissue]
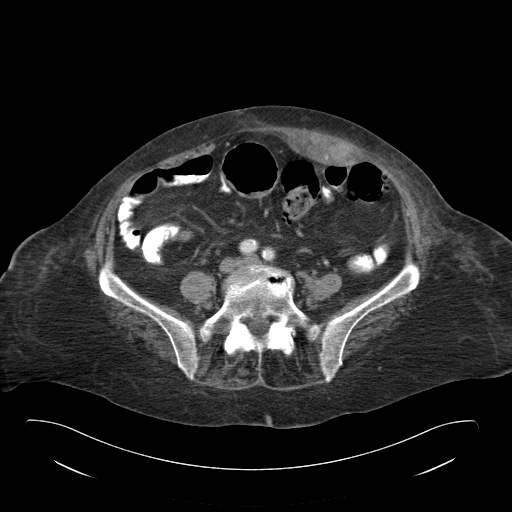
[im 48/124  soft-tissue]
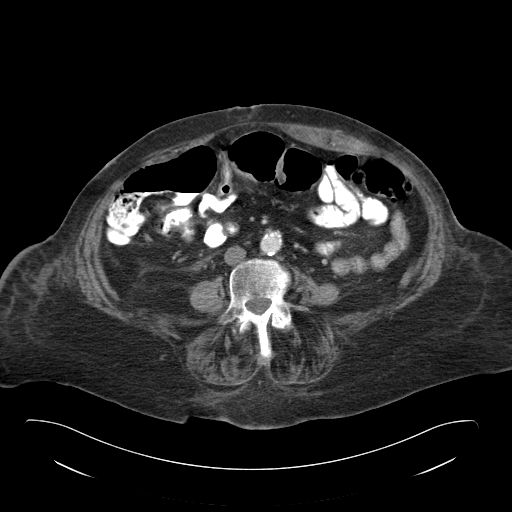
[im 62/124  soft-tissue]
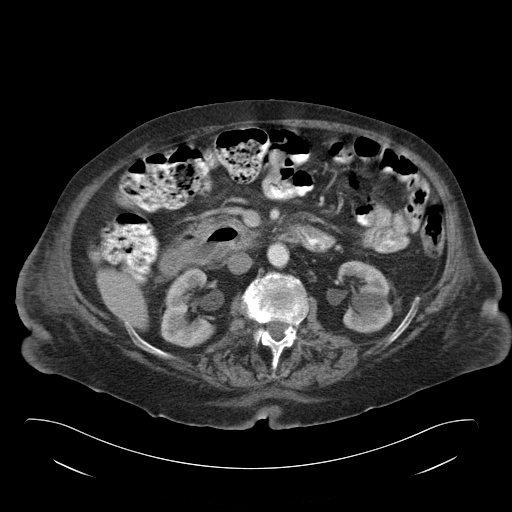
[im 76/124  soft-tissue]
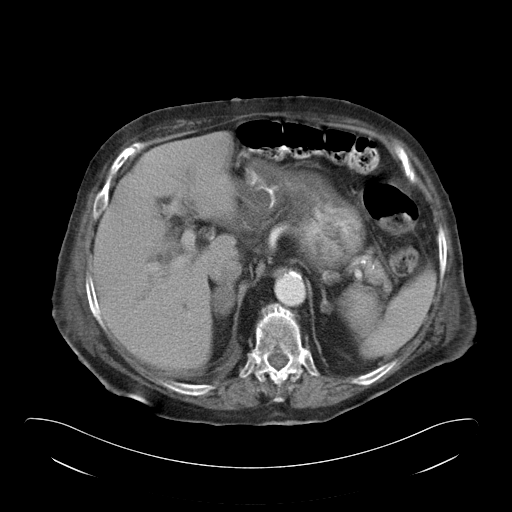
[im 83/124  soft-tissue]
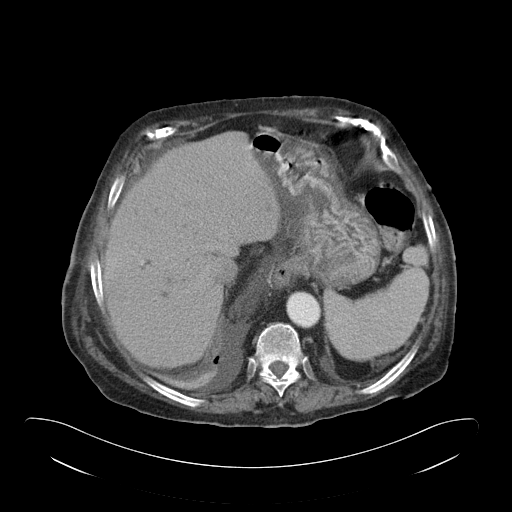
[im 96/124  soft-tissue]
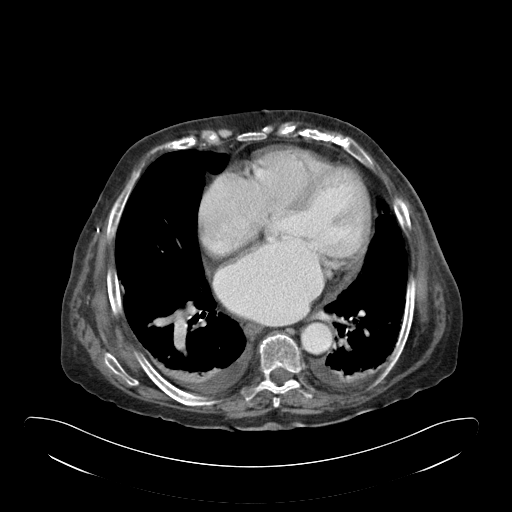
[im 96/124  lung]
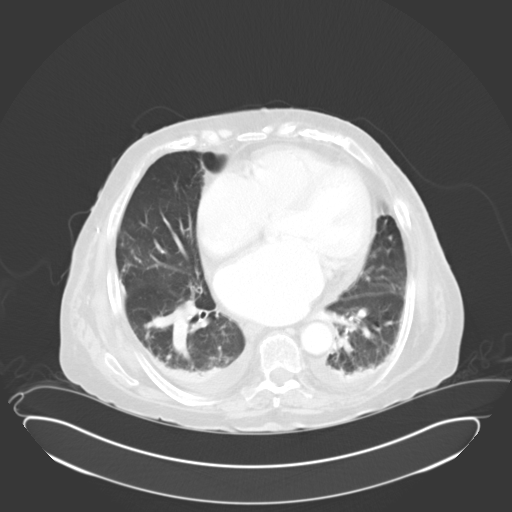
[im 96/124  bone]
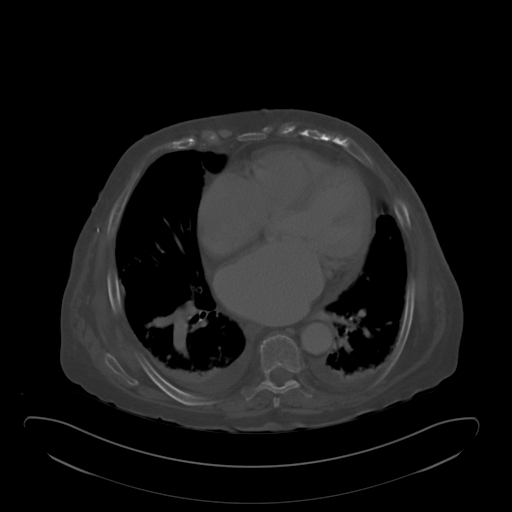
[im 103/124  soft-tissue]
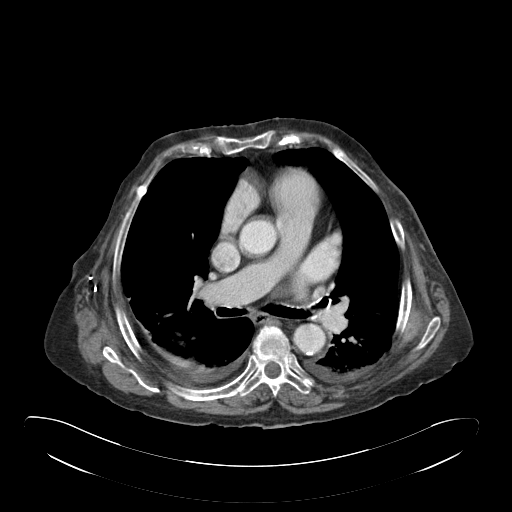
[im 103/124  lung]
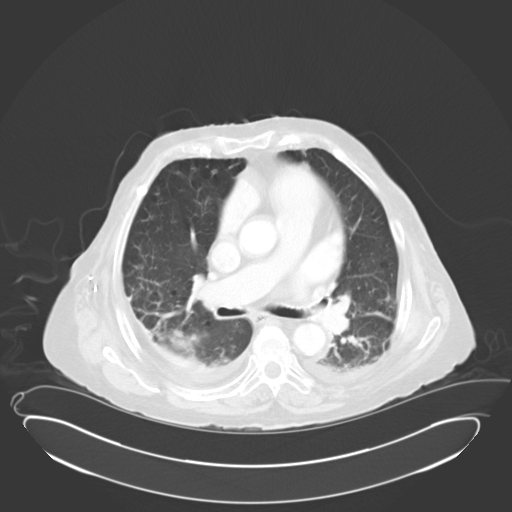
[im 110/124  lung]
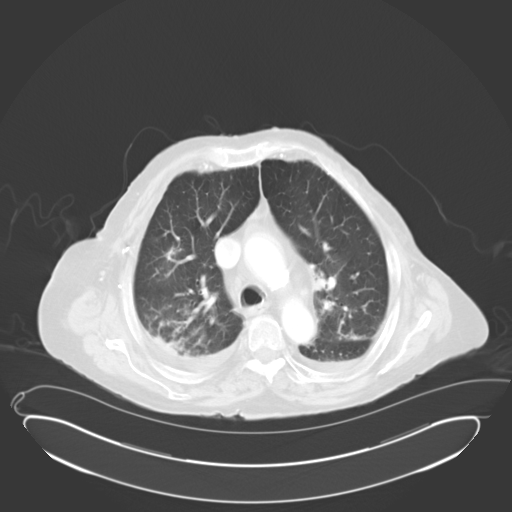
[im 117/124  soft-tissue]
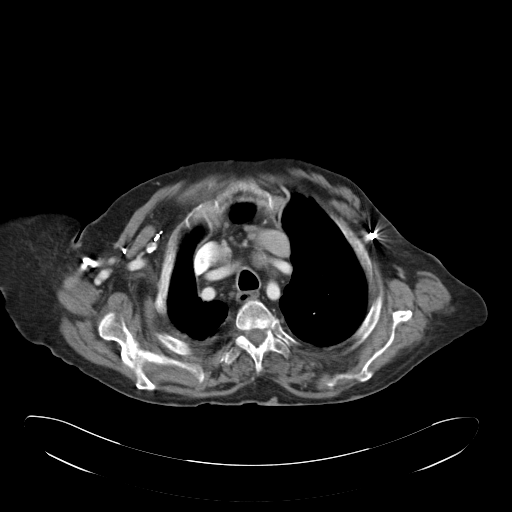
[im 117/124  lung]
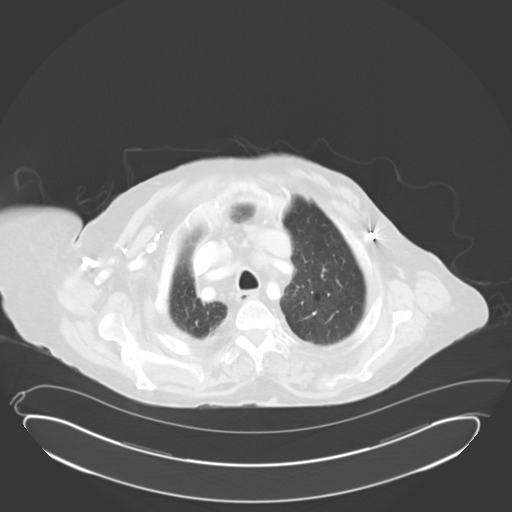

[12 of 32 positions shown; findings below may reference images not displayed]

FINDINGS: CT CHEST FINDINGS

Mediastinum/Nodes: No axillary or supraclavicular lymphadenopathy.
No mediastinal hilar lymphadenopathy. No pericardial fluid. No
central pulmonary embolism. Esophagus normal.

Multiple cystic appearing lesions within the thyroid gland which are
similar to CT of 07/27/2010. One of the larger lesions measuring 36
mm increased from 22 mm.

Lungs/Pleura: There is patchy reticular nodular airspace disease in
leftand right lower lobe. There small bilateral pleural effusions
associated atelectasis. There is a posterior right hernia of the
diaphragm in the medial right lung base.

CT ABDOMEN AND PELVIS FINDINGS

Hepatobiliary: Mild intrahepatic biliary duct dilatation. The common
bile duct measures 12 mm. Postcholecystectomy.

Pancreas: There is some stranding surrounding the pancreatic head.
There is a periampullary diverticulum measuring 23 mm. There is a
duodenum diverticulum at the junction the second third portion
measuring 38 mm. There is fluid along the left para renal fascia and
small of fluid along the second portion the duodenum and in
Morrison's pouch. The pancreashas normal parenchyma without evidence
organized fluid collection.

Spleen: Normal spleen

Adrenals/urinary tract: There is a large lesion of the left adrenal
gland measuring 29 x 35 mm slightly increased from 27 x 20 mm on
prior. On the delayed imaging there is a rim of low attenuation
tissue which has washout characteristics of a benign adenoma. More
centrally within the gland there is a rounded lesion measuring 19 mm
(image 4, series 6) this rounded central lesion is not changed from
09/20/2012 measuring 19 mm compared to 22 mm than.

Stable enlargement of the left adrenal gland is discretion stable
large on the right adrenal gland to 25 mm.

Stomach/Bowel: There is thickening of the gastric antrum and pyloric
region of stomach (image 48 and 49 of series 2). No evidence bowel
obstruction. Small bowel cecum are normal. The colon has multiple
diverticula through the sigmoid region

Vascular/Lymphatic: Abdominal aorta is normal caliber. There is no
retroperitoneal or periportal lymphadenopathy. No pelvic
lymphadenopathy.

Reproductive: Prostate gland is normal.

Musculoskeletal: No aggressive osseous lesion.There is a hematoma in
the left rectus sheath measuring 3.2 by 2.2 cm axial dimension.
Hematoma measures 7 cm in craniocaudad dimension

Other: No free fluid.
IMPRESSION: Chest Impression:

1. Bibasilar reticular nodular airspace opacities suggests mild
pneumonia or aspiration pneumonitis. Findings worse on the right.
2. Bilateral small effusions.
3. Cystic enlargement of the thyroid gland likely represents benign
multinodular goiter.

Abdomen / Pelvis Impression:

1. Peripancreatic inflammation and fluid suggest acute pancreatitis.
No organized fluid collections.
2. Edema of the gastric antrum and pylorus therefore would also
consider duodenitis or gastritis as source of this upper abdominal
inflammation.
3. Periampullary duodenum diverticulum.
4. Dilatation of the common bile duct and mild intrahepatic biliary
duct dilatation is similar and likely relates to prior
cholecystectomy.
5. There is a rounded lesion with an enlarged left adrenal gland.
This finding is relatively stable from 09/20/2012 and therefore
favors a benign process. Further characterization could potentially
be achieved with FDG PET scan or MRI. The right adrenal gland is
also enlarged and unchanged from prior.
6. Small to moderate left rectus sheath hematoma.
